# Patient Record
Sex: Female | Born: 1945 | Race: White | Hispanic: No | Smoking: Never smoker
Health system: Southern US, Community
[De-identification: ages and names within clinical notes are randomized; demographics above are authoritative.]

## PROBLEM LIST (undated history)

## (undated) DIAGNOSIS — M1990 Unspecified osteoarthritis, unspecified site: Secondary | ICD-10-CM

## (undated) DIAGNOSIS — N189 Chronic kidney disease, unspecified: Secondary | ICD-10-CM

## (undated) DIAGNOSIS — Z87442 Personal history of urinary calculi: Secondary | ICD-10-CM

## (undated) DIAGNOSIS — R7303 Prediabetes: Secondary | ICD-10-CM

## (undated) DIAGNOSIS — I10 Essential (primary) hypertension: Secondary | ICD-10-CM

## (undated) DIAGNOSIS — I639 Cerebral infarction, unspecified: Secondary | ICD-10-CM

## (undated) DIAGNOSIS — I2510 Atherosclerotic heart disease of native coronary artery without angina pectoris: Secondary | ICD-10-CM

## (undated) DIAGNOSIS — R519 Headache, unspecified: Secondary | ICD-10-CM

## (undated) DIAGNOSIS — F419 Anxiety disorder, unspecified: Secondary | ICD-10-CM

## (undated) HISTORY — DX: Cerebral infarction, unspecified: I63.9

---

## 1977-08-26 HISTORY — PX: VARICOSE VEIN SURGERY: SHX832

## 1977-11-24 HISTORY — PX: TUBAL LIGATION: SHX77

## 1999-09-21 ENCOUNTER — Other Ambulatory Visit: Admission: RE | Admit: 1999-09-21 | Discharge: 1999-09-21 | Payer: Self-pay | Admitting: Family Medicine

## 2000-10-08 ENCOUNTER — Other Ambulatory Visit: Admission: RE | Admit: 2000-10-08 | Discharge: 2000-10-08 | Payer: Self-pay | Admitting: *Deleted

## 2001-10-21 ENCOUNTER — Other Ambulatory Visit: Admission: RE | Admit: 2001-10-21 | Discharge: 2001-10-21 | Payer: Self-pay | Admitting: *Deleted

## 2003-11-02 ENCOUNTER — Other Ambulatory Visit: Admission: RE | Admit: 2003-11-02 | Discharge: 2003-11-02 | Payer: Self-pay | Admitting: Family Medicine

## 2005-01-31 ENCOUNTER — Other Ambulatory Visit: Admission: RE | Admit: 2005-01-31 | Discharge: 2005-01-31 | Payer: Self-pay | Admitting: Family Medicine

## 2007-06-03 ENCOUNTER — Emergency Department (HOSPITAL_COMMUNITY): Admission: EM | Admit: 2007-06-03 | Discharge: 2007-06-03 | Payer: Self-pay | Admitting: Emergency Medicine

## 2007-07-21 ENCOUNTER — Ambulatory Visit: Payer: Self-pay | Admitting: Cardiology

## 2007-07-31 ENCOUNTER — Ambulatory Visit: Payer: Self-pay

## 2007-08-13 ENCOUNTER — Ambulatory Visit: Payer: Self-pay | Admitting: Cardiology

## 2011-01-08 NOTE — Assessment & Plan Note (Signed)
[facility]                            CARDIOLOGY OFFICE NOTE   CIJI, BOSTON                      MRN:          [id number]  DATE:08/13/2007                            DOB:          [birth year]    PRIMARY CARE PHYSICIAN:  Talmadge Coventry, M.D.   REASON FOR VISIT:  Follow up cardiac testing.   HISTORY OF PRESENT ILLNESS:  I saw Ms. Jolley back in November.  She had  symptoms at that time including episodic weakness and presyncope as well  as fairly atypical chest pain/lower costal discomfort.  I referred her  for a baseline ischemic evaluation and she underwent an exercise  Myoview. This study revealed no clearly diagnostic ST segment changes to  suggest ischemia, a normal ejection fraction of 71% with normal wall  motion, and normal perfusion imaging.  I reviewed these results with the  patient today.  She states that she has had a few episodes of costal  discomfort, typically toward the evening, but no other new symptoms.  She did seem reassured by her cardiac testing and at this point I would  not anticipate any further cardiac studies.  It may be worth considering  further imaging of the thorax, such as a chest CT scan, if her symptoms  persist.  I have asked her to review this with Dr. Smith Mince.   ALLERGIES:  CODEINE.   PRESENT MEDICATIONS:  1. Atenolol 25 mg p.o. b.i.d.  2. Triamterine/hydrochlorothiazide 37.5/25 mg p.o. daily.  3. Lexapro 5 mg p.o. daily.  4. Prempro 0.625 mg/2.5 mg p.o. daily.  5. Midrin two tablets as directed.  6. Zolpidem 10 mg p.o. q.h.s.  7. Calcium with vitamin C.  8. Omega-3 supplements.   REVIEW OF SYSTEMS:  As described in the history of present illness.   EXAMINATION:  Blood pressure 139/90, heart rate is 81, weight is 154  pounds.  Otherwise no significant change in baseline examination as  documented in the last note.   IMPRESSION AND RECOMMENDATIONS:  1. Atypical chest/costal discomfort with  very reassuring Cardiolite      demonstrating no ischemia either by electrocardiographic or      perfusion imaging data.  Ejection fraction was also normal.  I      doubt that further ischemic testing is needed at this time.  I      expect Ms. Cassel can continue to follow up with Dr. Smith Mince.      Consideration could be given to further      imaging of the thorax such as a CT scan of the chest, although I      will defer to her on this.  2. Cardiology follow-up can be as needed.     Jonelle Sidle, MD  Electronically Signed    SGM/MedQ  DD: 08/13/2007  DT: 08/14/2007  Job #: [id number]   cc:   Talmadge Coventry, M.D.

## 2011-01-08 NOTE — Assessment & Plan Note (Signed)
[facility]                            CARDIOLOGY OFFICE NOTE   Cassandra Bailey, Cassandra Bailey                      MRN:          [id number]  DATE:07/21/2007                            DOB:          1945/12/18    REASON FOR CONSULTATION:  Recent chest pain.   HISTORY OF PRESENT ILLNESS:  Cassandra Bailey is a pleasant 65 year old woman  with recently diagnosed hypertension, treated with beta-blocker therapy  and diuretic therapy, and no clearly documented history of  cardiovascular disease or myocardial infarction.  She has had a number  of different symptoms over the last 1-2 months.  Back in early October,  she states that she was at work [employer]) and began to  feel weak as if she might pass out.  She was taken to the [facility]  Emergency Department and in reviewing the records she was not describing  any chest pain at that time.  I note that her blood work revealed a  point-of-care troponin I level that was normal at less than 0.05 with a  normal myoglobin of 73.3.  She tells me that no specific diagnosis was  made and since that time she has not had any recurrence of this problem.   She has also had trouble with her blood pressure being elevated,  reporting measurements such as 182/99 prompting  initial therapy with Coreg CR, although this was changed to a diuretic  and atenolol over the last month by Dr. Smith Mince.  She has also been  placed on Lexapro recently.  She has had some problems with anxiety,  possibly depression.  She began experiencing some symptoms of lower costal discomfort both  anteriorly and posteriorly back around November 13th, and she seemed to  notice this somewhat cyclically around 7 p.m. each night for several  days, although over the last week she has not noticed these symptoms.  She found no other precipitant for it and states this would usually last  about an hour.   In addition, she has had a strain sensation in  her upper back and  between her shoulder blades.  She seems to feel this is worse when she  stands up.  She reports having a standard treadmill test back in 2001,  in the setting of palpitations and reportedly this was normal.  She has  had no other recent testing.   Parenthetically, she also mentions a history of intermittent heart  skips which sound very much like premature ventricular complexes based  on her description.  She has had no sustained palpitations since  previous anxiety attacks, when she was going through a divorce.   Her electrocardiogram in the clinic today is normal, showing a sinus  rhythm at 67 beats per minute.   ALLERGIES:  CODEINE.   PRESENT MEDICATIONS:  1. Atenolol 25 mg p.o. b.i.d.  2. Triamterene/hydrochlorothiazide 37.5/25 mg p.o. daily.  3. Lexapro 5 mg p.o. daily.  4. Prempro 0.625/2.5 mg p.o. daily.  5. Midrin 2 tablets as directed.  6. Zolpidem 10 mg p.o. q.h.s.  7. Calcium with vitamin D supplements.  8.  Omega-3 supplements 1200 mg p.o. daily.  9. Sudafed p.r.n.  10.Motrin p.r.n.   PAST MEDICAL HISTORY:  As outlined above. The patient has a previous  history of:  1. Leg vein surgery.  2. Tubal ligation.  No clear history of dyslipidemia or type 2 diabetes mellitus.   SOCIAL HISTORY:  The patient is divorced.  She has 2 children.  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  She denies  any tobacco use.  She drinks perhaps 2 glasses of wine a week and 2 cups  of coffee daily.  No exercising currently, although she has done some  walking regularly in the past.   FAMILY HISTORY:  Reviewed.  The patient's died at age 69 with cancer,  having experienced a heart attack 2 years prior.  Mother died at age 8  with pneumonia.   REVIEW OF SYSTEMS:  As described in the history of present illness, also  has intermittent arthritic symptoms, anxiety, some fatigue, intermittent  constipation, seasonal allergies, otherwise  negative.   PHYSICAL EXAMINATION:  VITAL SIGNS:  Blood pressure 125/85, heart rate  is 67, weight 154.  GENERAL:  This a normally nourished appearing woman, no acute distress.  HEENT:  Conjunctivae and lids are normal.  Oropharynx clear.  NECK:  Supple.  No elevated jugular venous pressure .  No loud bruits.  No thyromegaly is noted.  LUNGS:  Clear, non-labored breathing.  No pleural rub.  CARDIAC:  Reveals a regular rate and rhythm.  No pericardial rub.  No  loud systolic murmur.  No S3 gallop.  ABDOMEN:  Soft, nontender.  Normoactive bowel sounds.  EXTREMITIES:  Exhibit no pitting edema.  Distal pulses are 2 plus.  SKIN:  Warm and dry.  MUSCULOSKELETAL:  No kyphosis noted.  NEUROPSYCHIATRIC:  The patient is alert and oriented x3.  Affect is  normal.   IMPRESSION/RECOMMENDATION:  1. Recent symptoms as outlined above.  The patient reports an episode      of weakness and presyncope back in early October of uncertain      etiology but associated with no chest pain and normal cardiac      markers following an emergency department visit.  Her      electrocardiogram today is normal.  She has also had some fairly      atypical lower costal pain described which has subsequently      resolved and upper intrascapular back discomfort.  She does report      a previous normal standard treadmill test in 2001, but has had no      followup ischemic evaluation.  We discussed proceeding with an      exercise Myoview for further risk stratification and I will      otherwise see her back to go over the results over the next few      weeks.  2. Further plans to follow.     Jonelle Sidle, MD  Electronically Signed    SGM/MedQ  DD: 07/21/2007  DT: 07/21/2007  Job #: [id number]   cc:   Talmadge Coventry, M.D.

## 2011-06-06 LAB — POCT I-STAT CREATININE: Operator id: 294521

## 2011-06-06 LAB — URINALYSIS, ROUTINE W REFLEX MICROSCOPIC
Bilirubin Urine: NEGATIVE
Nitrite: NEGATIVE
Protein, ur: NEGATIVE
Specific Gravity, Urine: 1.013
Urobilinogen, UA: 0.2

## 2011-06-06 LAB — POCT CARDIAC MARKERS
CKMB, poc: 1.3
Operator id: 294521
Troponin i, poc: <0.05

## 2011-06-06 LAB — I-STAT 8, (EC8 V) (CONVERTED LAB)
BUN: 11
Bicarbonate: 28.2 — ABNORMAL HIGH
Glucose, Bld: 125 — ABNORMAL HIGH
Hemoglobin: 14.6
Sodium: 138
TCO2: 30
pCO2, Ven: 48.2

## 2011-06-06 LAB — URINE CULTURE

## 2011-09-26 DIAGNOSIS — H524 Presbyopia: Secondary | ICD-10-CM | POA: Diagnosis not present

## 2011-09-26 DIAGNOSIS — E119 Type 2 diabetes mellitus without complications: Secondary | ICD-10-CM | POA: Diagnosis not present

## 2011-10-30 DIAGNOSIS — L600 Ingrowing nail: Secondary | ICD-10-CM | POA: Diagnosis not present

## 2011-11-26 DIAGNOSIS — R5383 Other fatigue: Secondary | ICD-10-CM | POA: Diagnosis not present

## 2011-11-26 DIAGNOSIS — E7800 Pure hypercholesterolemia, unspecified: Secondary | ICD-10-CM | POA: Diagnosis not present

## 2011-11-26 DIAGNOSIS — E559 Vitamin D deficiency, unspecified: Secondary | ICD-10-CM | POA: Diagnosis not present

## 2011-11-26 DIAGNOSIS — Z0000 Encounter for general adult medical examination without abnormal findings: Secondary | ICD-10-CM | POA: Diagnosis not present

## 2011-11-26 DIAGNOSIS — E782 Mixed hyperlipidemia: Secondary | ICD-10-CM | POA: Diagnosis not present

## 2011-11-26 DIAGNOSIS — R5381 Other malaise: Secondary | ICD-10-CM | POA: Diagnosis not present

## 2011-11-26 DIAGNOSIS — I10 Essential (primary) hypertension: Secondary | ICD-10-CM | POA: Diagnosis not present

## 2011-11-28 DIAGNOSIS — M130 Polyarthritis, unspecified: Secondary | ICD-10-CM | POA: Diagnosis not present

## 2011-12-06 DIAGNOSIS — H908 Mixed conductive and sensorineural hearing loss, unspecified: Secondary | ICD-10-CM | POA: Diagnosis not present

## 2011-12-06 DIAGNOSIS — E785 Hyperlipidemia, unspecified: Secondary | ICD-10-CM | POA: Diagnosis not present

## 2011-12-06 DIAGNOSIS — M159 Polyosteoarthritis, unspecified: Secondary | ICD-10-CM | POA: Diagnosis not present

## 2011-12-06 DIAGNOSIS — I10 Essential (primary) hypertension: Secondary | ICD-10-CM | POA: Diagnosis not present

## 2011-12-06 DIAGNOSIS — R5381 Other malaise: Secondary | ICD-10-CM | POA: Diagnosis not present

## 2012-05-29 DIAGNOSIS — R5381 Other malaise: Secondary | ICD-10-CM | POA: Diagnosis not present

## 2012-05-29 DIAGNOSIS — R5383 Other fatigue: Secondary | ICD-10-CM | POA: Diagnosis not present

## 2012-05-29 DIAGNOSIS — I10 Essential (primary) hypertension: Secondary | ICD-10-CM | POA: Diagnosis not present

## 2012-05-29 DIAGNOSIS — E785 Hyperlipidemia, unspecified: Secondary | ICD-10-CM | POA: Diagnosis not present

## 2012-05-29 DIAGNOSIS — M159 Polyosteoarthritis, unspecified: Secondary | ICD-10-CM | POA: Diagnosis not present

## 2012-06-05 DIAGNOSIS — E785 Hyperlipidemia, unspecified: Secondary | ICD-10-CM | POA: Diagnosis not present

## 2012-06-05 DIAGNOSIS — M25579 Pain in unspecified ankle and joints of unspecified foot: Secondary | ICD-10-CM | POA: Diagnosis not present

## 2012-06-05 DIAGNOSIS — M159 Polyosteoarthritis, unspecified: Secondary | ICD-10-CM | POA: Diagnosis not present

## 2012-06-05 DIAGNOSIS — I10 Essential (primary) hypertension: Secondary | ICD-10-CM | POA: Diagnosis not present

## 2012-06-19 DIAGNOSIS — R5383 Other fatigue: Secondary | ICD-10-CM | POA: Diagnosis not present

## 2012-06-19 DIAGNOSIS — M159 Polyosteoarthritis, unspecified: Secondary | ICD-10-CM | POA: Diagnosis not present

## 2012-06-19 DIAGNOSIS — E785 Hyperlipidemia, unspecified: Secondary | ICD-10-CM | POA: Diagnosis not present

## 2012-06-19 DIAGNOSIS — I10 Essential (primary) hypertension: Secondary | ICD-10-CM | POA: Diagnosis not present

## 2012-06-19 DIAGNOSIS — R5381 Other malaise: Secondary | ICD-10-CM | POA: Diagnosis not present

## 2012-07-16 DIAGNOSIS — M159 Polyosteoarthritis, unspecified: Secondary | ICD-10-CM | POA: Diagnosis not present

## 2012-07-16 DIAGNOSIS — M25579 Pain in unspecified ankle and joints of unspecified foot: Secondary | ICD-10-CM | POA: Diagnosis not present

## 2012-07-16 DIAGNOSIS — I10 Essential (primary) hypertension: Secondary | ICD-10-CM | POA: Diagnosis not present

## 2012-07-22 DIAGNOSIS — M76829 Posterior tibial tendinitis, unspecified leg: Secondary | ICD-10-CM | POA: Diagnosis not present

## 2012-07-28 DIAGNOSIS — M76829 Posterior tibial tendinitis, unspecified leg: Secondary | ICD-10-CM | POA: Diagnosis not present

## 2012-07-29 DIAGNOSIS — M76829 Posterior tibial tendinitis, unspecified leg: Secondary | ICD-10-CM | POA: Diagnosis not present

## 2012-07-31 DIAGNOSIS — M76829 Posterior tibial tendinitis, unspecified leg: Secondary | ICD-10-CM | POA: Diagnosis not present

## 2012-08-03 DIAGNOSIS — M76829 Posterior tibial tendinitis, unspecified leg: Secondary | ICD-10-CM | POA: Diagnosis not present

## 2012-08-05 DIAGNOSIS — M76829 Posterior tibial tendinitis, unspecified leg: Secondary | ICD-10-CM | POA: Diagnosis not present

## 2012-08-07 DIAGNOSIS — M76829 Posterior tibial tendinitis, unspecified leg: Secondary | ICD-10-CM | POA: Diagnosis not present

## 2012-08-10 DIAGNOSIS — M76829 Posterior tibial tendinitis, unspecified leg: Secondary | ICD-10-CM | POA: Diagnosis not present

## 2012-08-11 DIAGNOSIS — M76829 Posterior tibial tendinitis, unspecified leg: Secondary | ICD-10-CM | POA: Diagnosis not present

## 2012-08-17 DIAGNOSIS — M76829 Posterior tibial tendinitis, unspecified leg: Secondary | ICD-10-CM | POA: Diagnosis not present

## 2012-08-20 DIAGNOSIS — M76829 Posterior tibial tendinitis, unspecified leg: Secondary | ICD-10-CM | POA: Diagnosis not present

## 2012-08-24 DIAGNOSIS — M76829 Posterior tibial tendinitis, unspecified leg: Secondary | ICD-10-CM | POA: Diagnosis not present

## 2012-08-27 DIAGNOSIS — M76829 Posterior tibial tendinitis, unspecified leg: Secondary | ICD-10-CM | POA: Diagnosis not present

## 2012-08-31 DIAGNOSIS — M76829 Posterior tibial tendinitis, unspecified leg: Secondary | ICD-10-CM | POA: Diagnosis not present

## 2012-09-04 DIAGNOSIS — M76829 Posterior tibial tendinitis, unspecified leg: Secondary | ICD-10-CM | POA: Diagnosis not present

## 2012-12-18 DIAGNOSIS — M159 Polyosteoarthritis, unspecified: Secondary | ICD-10-CM | POA: Diagnosis not present

## 2012-12-18 DIAGNOSIS — I10 Essential (primary) hypertension: Secondary | ICD-10-CM | POA: Diagnosis not present

## 2012-12-18 DIAGNOSIS — R5383 Other fatigue: Secondary | ICD-10-CM | POA: Diagnosis not present

## 2012-12-18 DIAGNOSIS — R5381 Other malaise: Secondary | ICD-10-CM | POA: Diagnosis not present

## 2012-12-18 DIAGNOSIS — Z1331 Encounter for screening for depression: Secondary | ICD-10-CM | POA: Diagnosis not present

## 2012-12-18 DIAGNOSIS — E785 Hyperlipidemia, unspecified: Secondary | ICD-10-CM | POA: Diagnosis not present

## 2012-12-18 DIAGNOSIS — Z0000 Encounter for general adult medical examination without abnormal findings: Secondary | ICD-10-CM | POA: Diagnosis not present

## 2012-12-25 DIAGNOSIS — Z23 Encounter for immunization: Secondary | ICD-10-CM | POA: Diagnosis not present

## 2012-12-25 DIAGNOSIS — E785 Hyperlipidemia, unspecified: Secondary | ICD-10-CM | POA: Diagnosis not present

## 2012-12-25 DIAGNOSIS — I10 Essential (primary) hypertension: Secondary | ICD-10-CM | POA: Diagnosis not present

## 2012-12-25 DIAGNOSIS — M159 Polyosteoarthritis, unspecified: Secondary | ICD-10-CM | POA: Diagnosis not present

## 2012-12-25 DIAGNOSIS — R5383 Other fatigue: Secondary | ICD-10-CM | POA: Diagnosis not present

## 2012-12-25 DIAGNOSIS — H908 Mixed conductive and sensorineural hearing loss, unspecified: Secondary | ICD-10-CM | POA: Diagnosis not present

## 2012-12-25 DIAGNOSIS — N951 Menopausal and female climacteric states: Secondary | ICD-10-CM | POA: Diagnosis not present

## 2012-12-25 DIAGNOSIS — R5381 Other malaise: Secondary | ICD-10-CM | POA: Diagnosis not present

## 2013-12-17 DIAGNOSIS — L600 Ingrowing nail: Secondary | ICD-10-CM | POA: Diagnosis not present

## 2013-12-17 DIAGNOSIS — M79609 Pain in unspecified limb: Secondary | ICD-10-CM | POA: Diagnosis not present

## 2013-12-17 DIAGNOSIS — L03039 Cellulitis of unspecified toe: Secondary | ICD-10-CM | POA: Diagnosis not present

## 2013-12-24 DIAGNOSIS — M79609 Pain in unspecified limb: Secondary | ICD-10-CM | POA: Diagnosis not present

## 2013-12-24 DIAGNOSIS — B351 Tinea unguium: Secondary | ICD-10-CM | POA: Diagnosis not present

## 2014-01-07 DIAGNOSIS — I10 Essential (primary) hypertension: Secondary | ICD-10-CM | POA: Diagnosis not present

## 2014-01-07 DIAGNOSIS — R5381 Other malaise: Secondary | ICD-10-CM | POA: Diagnosis not present

## 2014-01-07 DIAGNOSIS — M159 Polyosteoarthritis, unspecified: Secondary | ICD-10-CM | POA: Diagnosis not present

## 2014-01-07 DIAGNOSIS — T8189XA Other complications of procedures, not elsewhere classified, initial encounter: Secondary | ICD-10-CM | POA: Diagnosis not present

## 2014-01-07 DIAGNOSIS — E785 Hyperlipidemia, unspecified: Secondary | ICD-10-CM | POA: Diagnosis not present

## 2014-01-14 DIAGNOSIS — M159 Polyosteoarthritis, unspecified: Secondary | ICD-10-CM | POA: Diagnosis not present

## 2014-01-14 DIAGNOSIS — E785 Hyperlipidemia, unspecified: Secondary | ICD-10-CM | POA: Diagnosis not present

## 2014-01-14 DIAGNOSIS — N951 Menopausal and female climacteric states: Secondary | ICD-10-CM | POA: Diagnosis not present

## 2014-01-14 DIAGNOSIS — I10 Essential (primary) hypertension: Secondary | ICD-10-CM | POA: Diagnosis not present

## 2014-01-21 DIAGNOSIS — B351 Tinea unguium: Secondary | ICD-10-CM | POA: Diagnosis not present

## 2014-01-21 DIAGNOSIS — M79609 Pain in unspecified limb: Secondary | ICD-10-CM | POA: Diagnosis not present

## 2014-01-24 DIAGNOSIS — T8189XA Other complications of procedures, not elsewhere classified, initial encounter: Secondary | ICD-10-CM | POA: Diagnosis not present

## 2014-02-18 DIAGNOSIS — B351 Tinea unguium: Secondary | ICD-10-CM | POA: Diagnosis not present

## 2014-02-18 DIAGNOSIS — M79609 Pain in unspecified limb: Secondary | ICD-10-CM | POA: Diagnosis not present

## 2014-03-18 DIAGNOSIS — M79609 Pain in unspecified limb: Secondary | ICD-10-CM | POA: Diagnosis not present

## 2014-03-18 DIAGNOSIS — Z01818 Encounter for other preprocedural examination: Secondary | ICD-10-CM | POA: Diagnosis not present

## 2014-03-18 DIAGNOSIS — B351 Tinea unguium: Secondary | ICD-10-CM | POA: Diagnosis not present

## 2014-06-10 DIAGNOSIS — M79609 Pain in unspecified limb: Secondary | ICD-10-CM | POA: Diagnosis not present

## 2014-07-01 DIAGNOSIS — E784 Other hyperlipidemia: Secondary | ICD-10-CM | POA: Diagnosis not present

## 2014-07-08 DIAGNOSIS — I10 Essential (primary) hypertension: Secondary | ICD-10-CM | POA: Diagnosis not present

## 2014-07-08 DIAGNOSIS — E782 Mixed hyperlipidemia: Secondary | ICD-10-CM | POA: Diagnosis not present

## 2014-09-30 DIAGNOSIS — H2513 Age-related nuclear cataract, bilateral: Secondary | ICD-10-CM | POA: Diagnosis not present

## 2014-11-28 DIAGNOSIS — H2511 Age-related nuclear cataract, right eye: Secondary | ICD-10-CM | POA: Diagnosis not present

## 2014-11-28 DIAGNOSIS — H04121 Dry eye syndrome of right lacrimal gland: Secondary | ICD-10-CM | POA: Diagnosis not present

## 2014-11-28 DIAGNOSIS — H04122 Dry eye syndrome of left lacrimal gland: Secondary | ICD-10-CM | POA: Diagnosis not present

## 2014-11-28 DIAGNOSIS — H18411 Arcus senilis, right eye: Secondary | ICD-10-CM | POA: Diagnosis not present

## 2014-12-27 DIAGNOSIS — H2511 Age-related nuclear cataract, right eye: Secondary | ICD-10-CM | POA: Diagnosis not present

## 2014-12-28 DIAGNOSIS — H25042 Posterior subcapsular polar age-related cataract, left eye: Secondary | ICD-10-CM | POA: Diagnosis not present

## 2014-12-28 DIAGNOSIS — H25012 Cortical age-related cataract, left eye: Secondary | ICD-10-CM | POA: Diagnosis not present

## 2014-12-28 DIAGNOSIS — H2512 Age-related nuclear cataract, left eye: Secondary | ICD-10-CM | POA: Diagnosis not present

## 2015-01-10 DIAGNOSIS — H2512 Age-related nuclear cataract, left eye: Secondary | ICD-10-CM | POA: Diagnosis not present

## 2015-01-20 DIAGNOSIS — I10 Essential (primary) hypertension: Secondary | ICD-10-CM | POA: Diagnosis not present

## 2015-01-20 DIAGNOSIS — E782 Mixed hyperlipidemia: Secondary | ICD-10-CM | POA: Diagnosis not present

## 2015-01-20 DIAGNOSIS — R5381 Other malaise: Secondary | ICD-10-CM | POA: Diagnosis not present

## 2015-01-27 DIAGNOSIS — Z780 Asymptomatic menopausal state: Secondary | ICD-10-CM | POA: Diagnosis not present

## 2015-01-27 DIAGNOSIS — L918 Other hypertrophic disorders of the skin: Secondary | ICD-10-CM | POA: Diagnosis not present

## 2015-01-27 DIAGNOSIS — I10 Essential (primary) hypertension: Secondary | ICD-10-CM | POA: Diagnosis not present

## 2015-01-27 DIAGNOSIS — E782 Mixed hyperlipidemia: Secondary | ICD-10-CM | POA: Diagnosis not present

## 2015-02-17 DIAGNOSIS — D1724 Benign lipomatous neoplasm of skin and subcutaneous tissue of left leg: Secondary | ICD-10-CM | POA: Diagnosis not present

## 2015-06-09 ENCOUNTER — Telehealth: Payer: Self-pay | Admitting: *Deleted

## 2015-06-09 NOTE — Telephone Encounter (Signed)
Opened in error

## 2015-07-28 DIAGNOSIS — E782 Mixed hyperlipidemia: Secondary | ICD-10-CM | POA: Diagnosis not present

## 2015-07-28 DIAGNOSIS — I10 Essential (primary) hypertension: Secondary | ICD-10-CM | POA: Diagnosis not present

## 2015-08-04 DIAGNOSIS — I10 Essential (primary) hypertension: Secondary | ICD-10-CM | POA: Diagnosis not present

## 2015-08-04 DIAGNOSIS — E782 Mixed hyperlipidemia: Secondary | ICD-10-CM | POA: Diagnosis not present

## 2015-08-04 DIAGNOSIS — Z780 Asymptomatic menopausal state: Secondary | ICD-10-CM | POA: Diagnosis not present

## 2015-08-04 DIAGNOSIS — M150 Primary generalized (osteo)arthritis: Secondary | ICD-10-CM | POA: Diagnosis not present

## 2016-02-16 DIAGNOSIS — M150 Primary generalized (osteo)arthritis: Secondary | ICD-10-CM | POA: Diagnosis not present

## 2016-02-16 DIAGNOSIS — Z0000 Encounter for general adult medical examination without abnormal findings: Secondary | ICD-10-CM | POA: Diagnosis not present

## 2016-02-16 DIAGNOSIS — I10 Essential (primary) hypertension: Secondary | ICD-10-CM | POA: Diagnosis not present

## 2016-02-16 DIAGNOSIS — E782 Mixed hyperlipidemia: Secondary | ICD-10-CM | POA: Diagnosis not present

## 2016-03-01 DIAGNOSIS — R3129 Other microscopic hematuria: Secondary | ICD-10-CM | POA: Diagnosis not present

## 2016-03-01 DIAGNOSIS — M150 Primary generalized (osteo)arthritis: Secondary | ICD-10-CM | POA: Diagnosis not present

## 2016-03-01 DIAGNOSIS — E782 Mixed hyperlipidemia: Secondary | ICD-10-CM | POA: Diagnosis not present

## 2016-03-01 DIAGNOSIS — N390 Urinary tract infection, site not specified: Secondary | ICD-10-CM | POA: Diagnosis not present

## 2016-03-15 DIAGNOSIS — N390 Urinary tract infection, site not specified: Secondary | ICD-10-CM | POA: Diagnosis not present

## 2016-03-15 DIAGNOSIS — R3129 Other microscopic hematuria: Secondary | ICD-10-CM | POA: Diagnosis not present

## 2016-03-15 DIAGNOSIS — I10 Essential (primary) hypertension: Secondary | ICD-10-CM | POA: Diagnosis not present

## 2016-03-22 DIAGNOSIS — M150 Primary generalized (osteo)arthritis: Secondary | ICD-10-CM | POA: Diagnosis not present

## 2016-03-22 DIAGNOSIS — I10 Essential (primary) hypertension: Secondary | ICD-10-CM | POA: Diagnosis not present

## 2016-03-22 DIAGNOSIS — R3129 Other microscopic hematuria: Secondary | ICD-10-CM | POA: Diagnosis not present

## 2016-03-22 DIAGNOSIS — E782 Mixed hyperlipidemia: Secondary | ICD-10-CM | POA: Diagnosis not present

## 2016-04-26 ENCOUNTER — Other Ambulatory Visit: Payer: Self-pay

## 2016-10-04 DIAGNOSIS — E782 Mixed hyperlipidemia: Secondary | ICD-10-CM | POA: Diagnosis not present

## 2016-10-04 DIAGNOSIS — N390 Urinary tract infection, site not specified: Secondary | ICD-10-CM | POA: Diagnosis not present

## 2016-10-11 DIAGNOSIS — M150 Primary generalized (osteo)arthritis: Secondary | ICD-10-CM | POA: Diagnosis not present

## 2016-10-11 DIAGNOSIS — I10 Essential (primary) hypertension: Secondary | ICD-10-CM | POA: Diagnosis not present

## 2016-10-11 DIAGNOSIS — E782 Mixed hyperlipidemia: Secondary | ICD-10-CM | POA: Diagnosis not present

## 2016-11-29 DIAGNOSIS — I10 Essential (primary) hypertension: Secondary | ICD-10-CM | POA: Diagnosis not present

## 2016-12-05 DIAGNOSIS — E782 Mixed hyperlipidemia: Secondary | ICD-10-CM | POA: Diagnosis not present

## 2016-12-05 DIAGNOSIS — I10 Essential (primary) hypertension: Secondary | ICD-10-CM | POA: Diagnosis not present

## 2017-01-17 DIAGNOSIS — E782 Mixed hyperlipidemia: Secondary | ICD-10-CM | POA: Diagnosis not present

## 2017-01-17 DIAGNOSIS — I10 Essential (primary) hypertension: Secondary | ICD-10-CM | POA: Diagnosis not present

## 2017-01-17 DIAGNOSIS — M150 Primary generalized (osteo)arthritis: Secondary | ICD-10-CM | POA: Diagnosis not present

## 2017-03-21 DIAGNOSIS — Z0000 Encounter for general adult medical examination without abnormal findings: Secondary | ICD-10-CM | POA: Diagnosis not present

## 2017-03-21 DIAGNOSIS — I10 Essential (primary) hypertension: Secondary | ICD-10-CM | POA: Diagnosis not present

## 2017-03-21 DIAGNOSIS — N390 Urinary tract infection, site not specified: Secondary | ICD-10-CM | POA: Diagnosis not present

## 2017-03-21 DIAGNOSIS — E782 Mixed hyperlipidemia: Secondary | ICD-10-CM | POA: Diagnosis not present

## 2017-03-28 DIAGNOSIS — M150 Primary generalized (osteo)arthritis: Secondary | ICD-10-CM | POA: Diagnosis not present

## 2017-03-28 DIAGNOSIS — I10 Essential (primary) hypertension: Secondary | ICD-10-CM | POA: Diagnosis not present

## 2017-03-28 DIAGNOSIS — R1033 Periumbilical pain: Secondary | ICD-10-CM | POA: Diagnosis not present

## 2017-03-28 DIAGNOSIS — Z23 Encounter for immunization: Secondary | ICD-10-CM | POA: Diagnosis not present

## 2017-03-28 DIAGNOSIS — E782 Mixed hyperlipidemia: Secondary | ICD-10-CM | POA: Diagnosis not present

## 2017-03-28 DIAGNOSIS — Z780 Asymptomatic menopausal state: Secondary | ICD-10-CM | POA: Diagnosis not present

## 2017-06-06 DIAGNOSIS — I10 Essential (primary) hypertension: Secondary | ICD-10-CM | POA: Diagnosis not present

## 2017-06-06 DIAGNOSIS — E782 Mixed hyperlipidemia: Secondary | ICD-10-CM | POA: Diagnosis not present

## 2017-07-04 DIAGNOSIS — S92351A Displaced fracture of fifth metatarsal bone, right foot, initial encounter for closed fracture: Secondary | ICD-10-CM | POA: Diagnosis not present

## 2017-07-04 DIAGNOSIS — S92302A Fracture of unspecified metatarsal bone(s), left foot, initial encounter for closed fracture: Secondary | ICD-10-CM | POA: Diagnosis not present

## 2017-07-04 DIAGNOSIS — M79672 Pain in left foot: Secondary | ICD-10-CM | POA: Diagnosis not present

## 2017-07-25 DIAGNOSIS — S92335A Nondisplaced fracture of third metatarsal bone, left foot, initial encounter for closed fracture: Secondary | ICD-10-CM | POA: Diagnosis not present

## 2017-08-20 DIAGNOSIS — S92335D Nondisplaced fracture of third metatarsal bone, left foot, subsequent encounter for fracture with routine healing: Secondary | ICD-10-CM | POA: Diagnosis not present

## 2017-09-29 DIAGNOSIS — I10 Essential (primary) hypertension: Secondary | ICD-10-CM | POA: Diagnosis not present

## 2017-09-29 DIAGNOSIS — E782 Mixed hyperlipidemia: Secondary | ICD-10-CM | POA: Diagnosis not present

## 2017-10-10 DIAGNOSIS — I10 Essential (primary) hypertension: Secondary | ICD-10-CM | POA: Diagnosis not present

## 2017-10-10 DIAGNOSIS — E782 Mixed hyperlipidemia: Secondary | ICD-10-CM | POA: Diagnosis not present

## 2017-10-17 DIAGNOSIS — I10 Essential (primary) hypertension: Secondary | ICD-10-CM | POA: Diagnosis not present

## 2017-10-17 DIAGNOSIS — M150 Primary generalized (osteo)arthritis: Secondary | ICD-10-CM | POA: Diagnosis not present

## 2017-10-17 DIAGNOSIS — J399 Disease of upper respiratory tract, unspecified: Secondary | ICD-10-CM | POA: Diagnosis not present

## 2017-10-17 DIAGNOSIS — R0981 Nasal congestion: Secondary | ICD-10-CM | POA: Diagnosis not present

## 2017-10-17 DIAGNOSIS — R0982 Postnasal drip: Secondary | ICD-10-CM | POA: Diagnosis not present

## 2017-10-17 DIAGNOSIS — E782 Mixed hyperlipidemia: Secondary | ICD-10-CM | POA: Diagnosis not present

## 2017-10-17 DIAGNOSIS — B349 Viral infection, unspecified: Secondary | ICD-10-CM | POA: Diagnosis not present

## 2017-11-09 ENCOUNTER — Inpatient Hospital Stay (HOSPITAL_COMMUNITY)
Admission: EM | Admit: 2017-11-09 | Discharge: 2017-11-11 | DRG: 042 | Disposition: A | Discharge status: Home / Self Care | Payer: Medicare Other | Attending: Internal Medicine | Admitting: Internal Medicine

## 2017-11-09 ENCOUNTER — Encounter (HOSPITAL_COMMUNITY): Payer: Self-pay

## 2017-11-09 ENCOUNTER — Other Ambulatory Visit: Payer: Self-pay

## 2017-11-09 ENCOUNTER — Emergency Department (HOSPITAL_COMMUNITY): Payer: Medicare Other

## 2017-11-09 DIAGNOSIS — R29700 NIHSS score 0: Secondary | ICD-10-CM | POA: Diagnosis not present

## 2017-11-09 DIAGNOSIS — Z6827 Body mass index (BMI) 27.0-27.9, adult: Secondary | ICD-10-CM

## 2017-11-09 DIAGNOSIS — I10 Essential (primary) hypertension: Secondary | ICD-10-CM | POA: Diagnosis not present

## 2017-11-09 DIAGNOSIS — G43909 Migraine, unspecified, not intractable, without status migrainosus: Secondary | ICD-10-CM | POA: Diagnosis present

## 2017-11-09 DIAGNOSIS — E663 Overweight: Secondary | ICD-10-CM | POA: Diagnosis present

## 2017-11-09 DIAGNOSIS — I639 Cerebral infarction, unspecified: Secondary | ICD-10-CM

## 2017-11-09 DIAGNOSIS — E785 Hyperlipidemia, unspecified: Secondary | ICD-10-CM | POA: Diagnosis not present

## 2017-11-09 DIAGNOSIS — Z79890 Hormone replacement therapy (postmenopausal): Secondary | ICD-10-CM

## 2017-11-09 DIAGNOSIS — Z79899 Other long term (current) drug therapy: Secondary | ICD-10-CM

## 2017-11-09 DIAGNOSIS — W1830XA Fall on same level, unspecified, initial encounter: Secondary | ICD-10-CM | POA: Diagnosis present

## 2017-11-09 DIAGNOSIS — F419 Anxiety disorder, unspecified: Secondary | ICD-10-CM | POA: Diagnosis present

## 2017-11-09 DIAGNOSIS — S299XXA Unspecified injury of thorax, initial encounter: Secondary | ICD-10-CM | POA: Diagnosis not present

## 2017-11-09 DIAGNOSIS — R0781 Pleurodynia: Secondary | ICD-10-CM | POA: Diagnosis not present

## 2017-11-09 DIAGNOSIS — I63341 Cerebral infarction due to thrombosis of right cerebellar artery: Principal | ICD-10-CM | POA: Diagnosis present

## 2017-11-09 DIAGNOSIS — E876 Hypokalemia: Secondary | ICD-10-CM | POA: Diagnosis present

## 2017-11-09 DIAGNOSIS — Z791 Long term (current) use of non-steroidal anti-inflammatories (NSAID): Secondary | ICD-10-CM

## 2017-11-09 DIAGNOSIS — S79912A Unspecified injury of left hip, initial encounter: Secondary | ICD-10-CM | POA: Diagnosis not present

## 2017-11-09 DIAGNOSIS — Z8249 Family history of ischemic heart disease and other diseases of the circulatory system: Secondary | ICD-10-CM

## 2017-11-09 DIAGNOSIS — G4700 Insomnia, unspecified: Secondary | ICD-10-CM | POA: Diagnosis present

## 2017-11-09 DIAGNOSIS — Y92009 Unspecified place in unspecified non-institutional (private) residence as the place of occurrence of the external cause: Secondary | ICD-10-CM

## 2017-11-09 DIAGNOSIS — Y93K1 Activity, walking an animal: Secondary | ICD-10-CM

## 2017-11-09 DIAGNOSIS — M25552 Pain in left hip: Secondary | ICD-10-CM | POA: Diagnosis present

## 2017-11-09 DIAGNOSIS — R42 Dizziness and giddiness: Secondary | ICD-10-CM | POA: Diagnosis not present

## 2017-11-09 DIAGNOSIS — I6300 Cerebral infarction due to thrombosis of unspecified precerebral artery: Secondary | ICD-10-CM

## 2017-11-09 DIAGNOSIS — I2510 Atherosclerotic heart disease of native coronary artery without angina pectoris: Secondary | ICD-10-CM | POA: Diagnosis present

## 2017-11-09 DIAGNOSIS — S0990XA Unspecified injury of head, initial encounter: Secondary | ICD-10-CM | POA: Diagnosis not present

## 2017-11-09 HISTORY — DX: Cerebral infarction, unspecified: I63.9

## 2017-11-09 HISTORY — DX: Atherosclerotic heart disease of native coronary artery without angina pectoris: I25.10

## 2017-11-09 HISTORY — DX: Essential (primary) hypertension: I10

## 2017-11-09 LAB — I-STAT TROPONIN, ED: Troponin i, poc: 0 ng/mL (ref 0.00–0.08)

## 2017-11-09 LAB — CBC
HCT: 41.2 % (ref 36.0–46.0)
Hemoglobin: 14.2 g/dL (ref 12.0–15.0)
MCH: 31.1 pg (ref 26.0–34.0)
MCHC: 34.5 g/dL (ref 30.0–36.0)
MCV: 90.2 fL (ref 78.0–100.0)
Platelets: 317 10*3/uL (ref 150–400)
RBC: 4.57 MIL/uL (ref 3.87–5.11)
RDW: 13.5 % (ref 11.5–15.5)
WBC: 9.2 10*3/uL (ref 4.0–10.5)

## 2017-11-09 LAB — PROTIME-INR
INR: 0.93
PROTHROMBIN TIME: 12.4 s (ref 11.4–15.2)

## 2017-11-09 NOTE — ED Triage Notes (Signed)
Patient here from home with a fall this am.  Did hit head but no wound noticed.  Not on blood thinners. Patient complaining of left hip and rib pain, X RAYS ordered. Vitals stable.  A&Ox4, ambulatory to triage.

## 2017-11-09 NOTE — ED Provider Notes (Signed)
[place] EMERGENCY DEPARTMENT Provider Note   CSN: [id number] Arrival date & time: 11/09/17  2012     History   Chief Complaint Chief Complaint  Patient presents with  . Fall  . Head Injury    HPI Cassandra Bailey is a 72 y.o. female.  72 year old female with a history of hypertension, coronary artery disease, dyslipidemia presents to the emergency department for an episode of dizziness.  She reports a history of vertiginous symptoms in the past, but states that her symptoms today felt different.  She had gone out into the garage to walk her dog around 7 AM when she had sudden onset of dizziness characterized more as balance difficulty.  She felt very nauseous with dry heaves.  This dizziness caused her to fall on her left side.  She did strike her head, but did not lose consciousness.  She denies any headache prior to her fall, but has had some residual soreness to her left temple since striking the ground.  She is not on blood thinners.  She was able to make her way back into the house until her dizziness resolved approximately 10 minutes later.  She has not had any residual dizziness and denies position change aggravating her symptoms.  She is now complaining of pain to her left chest wall as well as her left hip.  She has not had any difficulty with ambulation.  She reports recent difficulty with blood pressure control.  She is currently on 75mg  Maxide, Avapro 300mg , and Atenolol daily for BP control.  She has not been followed by a cardiologist.  No hx of recent fevers or illness.  No lightheadedness, syncope or near syncope, blurry vision, vision loss, tinnitus, hearing loss, extremity numbness or paresthesias, extremity weakness, difficulty speaking or swallowing.      Past Medical History:  Diagnosis Date  . Coronary artery disease   . Hypertension     There are no active problems to display for this patient.   History reviewed. No pertinent surgical  history.  OB History    No data available       Home Medications    Prior to Admission medications   Not on File    Family History History reviewed. No pertinent family history.  Social History Social History   Tobacco Use  . Smoking status: Never Smoker  . Smokeless tobacco: Never Used  Substance Use Topics  . Alcohol use: Yes  . Drug use: No     Allergies   Codeine   Review of Systems Review of Systems Ten systems reviewed and are negative for acute change, except as noted in the HPI.    Physical Exam Updated Vital Signs BP (!) 146/91   Pulse 84   Temp 98.1 F (36.7 C)   Resp 19   SpO2 96%   Physical Exam  Constitutional: She is oriented to person, place, and time. She appears well-developed and well-nourished. No distress.  Nontoxic appearing, pleasant.  HENT:  Head: Normocephalic and atraumatic.  Mouth/Throat: Oropharynx is clear and moist.  Symmetric rise of the uvula with phonation. No battle's sign or raccoon's eyes.  Eyes: Conjunctivae and EOM are normal. Pupils are equal, round, and reactive to light. No scleral icterus.  Neck: Normal range of motion.  Cardiovascular: Normal rate, regular rhythm and intact distal pulses.  Pulmonary/Chest: Effort normal. No stridor. No respiratory distress.  Respirations even and unlabored  Musculoskeletal: Normal range of motion.  Neurological: She is alert and  oriented to person, place, and time. No cranial nerve deficit. She exhibits normal muscle tone. Coordination normal.  GCS 15. Speech is goal oriented. No cranial nerve deficits appreciated; symmetric eyebrow raise, no facial drooping, tongue midline. Patient has equal grip strength bilaterally with 5/5 strength against resistance in all major muscle groups bilaterally. Sensation to light touch intact. Patient moves extremities without ataxia. No pronator drift. Patient ambulatory with steady gait.  Skin: Skin is warm and dry. No rash noted. She is not  diaphoretic. No erythema. No pallor.  Psychiatric: She has a normal mood and affect. Her behavior is normal.  Nursing note and vitals reviewed.    ED Treatments / Results  Labs (all labs ordered are listed, but only abnormal results are displayed) Labs Reviewed  COMPREHENSIVE METABOLIC PANEL - Abnormal; Notable for the following components:      Result Value   Potassium 3.4 (*)    Chloride 100 (*)    Glucose, Bld 113 (*)    All other components within normal limits  CBC  PROTIME-INR  RAPID URINE DRUG SCREEN, HOSP PERFORMED  I-STAT TROPONIN, ED    EKG  EKG Interpretation  Date/Time:  Sunday November 09 2017 23:36:03 EDT Ventricular Rate:  78 PR Interval:  174 QRS Duration: 84 QT Interval:  384 QTC Calculation: 437 R Axis:   52 Text Interpretation:  Normal sinus rhythm Normal ECG No significant change since last tracing Confirmed by Merrily Pew [phone]) on 11/10/2017 12:04:13 AM       Radiology Dg Ribs Unilateral W/chest Left  Result Date: 11/09/2017 CLINICAL DATA:  Fall at home in garage onto concrete floor. Left rib pain. EXAM: LEFT RIBS AND CHEST - 3+ VIEW COMPARISON:  None. FINDINGS: No evidence fracture or other bone lesions are seen involving the ribs. There is no evidence of pneumothorax or pleural effusion. Minimal subsegmental atelectasis at the left lung base. Heart size and mediastinal contours are within normal limits. IMPRESSION: 1. No visualized rib fracture or pulmonary complication. 2. Minimal subsegmental left basilar atelectasis. Electronically Signed   By: Jeb Levering M.D.   On: 11/09/2017 21:58   Ct Head Wo Contrast  Result Date: 11/09/2017 CLINICAL DATA:  Vertigo with fall EXAM: CT HEAD WITHOUT CONTRAST TECHNIQUE: Contiguous axial images were obtained from the base of the skull through the vertex without intravenous contrast. COMPARISON:  None. FINDINGS: Brain: There is age related volume loss. There is no intracranial mass, hemorrhage, extra-axial  fluid collection, or midline shift. There is decreased attenuation in the left medial pons-midbrain junction consistent with an age uncertain and potentially recent infarct. Elsewhere there is slight small vessel disease in the centra semiovale bilaterally. Vascular: There are no hyperdense vessels. There is calcification in each carotid siphon. Skull: Bony calvarium appears intact. Sinuses/Orbits: Paranasal sinuses are clear. Orbits appear symmetric bilaterally. Other: Mastoid air cells are clear. IMPRESSION: Age uncertain but potentially recent infarct in the medial left pons-midbrain junction. There is slight periventricular small vessel disease. Elsewhere gray-white compartments appear normal. No mass or hemorrhage. There are foci of arterial vascular calcification. Electronically Signed   By: Lowella Grip III M.D.   On: 11/09/2017 23:49   Dg Hip Unilat With Pelvis 2-3 Views Left  Result Date: 11/09/2017 CLINICAL DATA:  Initial evaluation for acute trauma, fall. EXAM: DG HIP (WITH OR WITHOUT PELVIS) 2-3V LEFT COMPARISON:  None. FINDINGS: No acute fracture or dislocation. Femoral heads in normal alignment within the acetabula. Femoral head height preserved. Bony pelvis intact. SI joints approximated.  Moderate osteoarthritic changes present about the hips bilaterally, left worse than right. No acute soft tissue abnormality. Degenerative changes noted within lower lumbar spine. IMPRESSION: 1. No acute osseous abnormality about the left hip. 2. Moderate degenerative osteoarthrosis. Electronically Signed   By: Jeannine Boga M.D.   On: 11/09/2017 21:59    Procedures Procedures (including critical care time)  Medications Ordered in ED Medications - No data to display   CRITICAL CARE Performed by: Antonietta Breach   Total critical care time: 35 minutes  Critical care time was exclusive of separately billable procedures and treating other patients.  Critical care was necessary to treat or  prevent imminent or life-threatening deterioration.  Critical care was time spent personally by me on the following activities: development of treatment plan with patient and/or surrogate as well as nursing, discussions with consultants, evaluation of patient's response to treatment, examination of patient, obtaining history from patient or surrogate, ordering and performing treatments and interventions, ordering and review of laboratory studies, ordering and review of radiographic studies, pulse oximetry and re-evaluation of patient's condition.   Initial Impression / Assessment and Plan / ED Course  I have reviewed the triage vital signs and the nursing notes.  Pertinent labs & imaging results that were available during my care of the patient were reviewed by me and considered in my medical decision making (see chart for details).     11:11 PM Patient presenting to the emergency department for a fall secondary to onset of dizziness lasting approximately 10 minutes before spontaneously resolving.  Patient reports a history of vertigo a few years ago and states that her symptoms today feel different.  Unable to exclude TIA as cause of symptoms.  ABCD2 score is 2 c/w low 90 day stroke risk.  Will initiate TIA w/u in the ED.  Patient aware of and comfortable with plan.  If labs and imaging negative, peripheral vertigo remains on the differential.  Anticipate discharge with referral to Cardiology and Neurology as well as back to PCP for follow up.  12:02 AM CT findings reviewed. Will discuss case with Neurology.  12:15 AM Case discussed with Dr. Lorraine Lax who recommend [facility] admission for further work up.  12:42 AM  Case discussed with Dr. Stana Bunting of [facility] who will admit.   Final Clinical Impressions(s) / ED Diagnoses   Final diagnoses:  Cerebrovascular accident (CVA), unspecified mechanism [facility])    ED Discharge Orders    None       Antonietta Breach, PA-C 11/10/17 0043    Merrily Pew,  MD 11/10/17 [phone]

## 2017-11-09 NOTE — ED Provider Notes (Signed)
Medical screening examination/treatment/procedure(s) were conducted as a shared visit with non-physician practitioner(s) and myself.  I personally evaluated the patient during the encounter.  72 year old female who had acute onset of dizziness earlier today unlike anything she experienced before.  She states that it was so bad she had a lean against the car when she finally did start step she fell flat on her face.  She did  hit her head and had some slight discomfort afterwards but no severe pain and she does not think she syncopized.  She does have left-sided rib pain is worse with movement and palpation but not really worse with breathing.  States that the symptoms were around for maybe 10-12 minutes that started to resolve.  She states she has a history of vertigo and this was nothing like that but cannot explain it much further.  No surrounding symptoms. Exam at this time she does have tenderness to palpation in the left side of her chest.  Hea and lungs sound normal.  Her neuro exam to include nerves, finger to nose, pronator drift and ambulation were all normal. Possible TIA versus a vagal type syndrome.  Will workup accordingly here disposition as indicated.   EKG Interpretation  Date/Time:  Sunday November 09 2017 23:36:03 EDT Ventricular Rate:  78 PR Interval:  174 QRS Duration: 84 QT Interval:  384 QTC Calculation: 437 R Axis:   52 Text Interpretation:  Normal sinus rhythm Normal ECG No significant change since last tracing Confirmed by Merrily Pew [phone]) on 11/10/2017 12:04:13 AM         Marieanne Marxen, Corene Cornea, MD 11/10/17 [phone]

## 2017-11-10 ENCOUNTER — Inpatient Hospital Stay (HOSPITAL_COMMUNITY): Payer: Medicare Other

## 2017-11-10 DIAGNOSIS — I639 Cerebral infarction, unspecified: Secondary | ICD-10-CM | POA: Diagnosis not present

## 2017-11-10 DIAGNOSIS — G43909 Migraine, unspecified, not intractable, without status migrainosus: Secondary | ICD-10-CM | POA: Diagnosis present

## 2017-11-10 DIAGNOSIS — W1830XA Fall on same level, unspecified, initial encounter: Secondary | ICD-10-CM | POA: Diagnosis present

## 2017-11-10 DIAGNOSIS — R269 Unspecified abnormalities of gait and mobility: Secondary | ICD-10-CM | POA: Diagnosis not present

## 2017-11-10 DIAGNOSIS — I2510 Atherosclerotic heart disease of native coronary artery without angina pectoris: Secondary | ICD-10-CM | POA: Diagnosis present

## 2017-11-10 DIAGNOSIS — M25552 Pain in left hip: Secondary | ICD-10-CM | POA: Diagnosis present

## 2017-11-10 DIAGNOSIS — Z8249 Family history of ischemic heart disease and other diseases of the circulatory system: Secondary | ICD-10-CM | POA: Diagnosis not present

## 2017-11-10 DIAGNOSIS — I63341 Cerebral infarction due to thrombosis of right cerebellar artery: Secondary | ICD-10-CM | POA: Diagnosis present

## 2017-11-10 DIAGNOSIS — E663 Overweight: Secondary | ICD-10-CM | POA: Diagnosis present

## 2017-11-10 DIAGNOSIS — Z6827 Body mass index (BMI) 27.0-27.9, adult: Secondary | ICD-10-CM | POA: Diagnosis not present

## 2017-11-10 DIAGNOSIS — E785 Hyperlipidemia, unspecified: Secondary | ICD-10-CM | POA: Diagnosis not present

## 2017-11-10 DIAGNOSIS — I6300 Cerebral infarction due to thrombosis of unspecified precerebral artery: Secondary | ICD-10-CM | POA: Diagnosis not present

## 2017-11-10 DIAGNOSIS — R29700 NIHSS score 0: Secondary | ICD-10-CM | POA: Diagnosis present

## 2017-11-10 DIAGNOSIS — Z791 Long term (current) use of non-steroidal anti-inflammatories (NSAID): Secondary | ICD-10-CM | POA: Diagnosis not present

## 2017-11-10 DIAGNOSIS — Y92009 Unspecified place in unspecified non-institutional (private) residence as the place of occurrence of the external cause: Secondary | ICD-10-CM | POA: Diagnosis not present

## 2017-11-10 DIAGNOSIS — Z79890 Hormone replacement therapy (postmenopausal): Secondary | ICD-10-CM | POA: Diagnosis not present

## 2017-11-10 DIAGNOSIS — I6389 Other cerebral infarction: Secondary | ICD-10-CM | POA: Diagnosis not present

## 2017-11-10 DIAGNOSIS — I5030 Unspecified diastolic (congestive) heart failure: Secondary | ICD-10-CM

## 2017-11-10 DIAGNOSIS — Z79899 Other long term (current) drug therapy: Secondary | ICD-10-CM | POA: Diagnosis not present

## 2017-11-10 DIAGNOSIS — I351 Nonrheumatic aortic (valve) insufficiency: Secondary | ICD-10-CM | POA: Diagnosis not present

## 2017-11-10 DIAGNOSIS — G4700 Insomnia, unspecified: Secondary | ICD-10-CM | POA: Diagnosis present

## 2017-11-10 DIAGNOSIS — F419 Anxiety disorder, unspecified: Secondary | ICD-10-CM | POA: Diagnosis present

## 2017-11-10 DIAGNOSIS — R42 Dizziness and giddiness: Secondary | ICD-10-CM | POA: Diagnosis not present

## 2017-11-10 DIAGNOSIS — Y93K1 Activity, walking an animal: Secondary | ICD-10-CM | POA: Diagnosis not present

## 2017-11-10 DIAGNOSIS — I10 Essential (primary) hypertension: Secondary | ICD-10-CM | POA: Diagnosis not present

## 2017-11-10 DIAGNOSIS — E876 Hypokalemia: Secondary | ICD-10-CM | POA: Diagnosis not present

## 2017-11-10 LAB — HEMOGLOBIN A1C
Hgb A1c MFr Bld: 5.9 % — ABNORMAL HIGH (ref 4.8–5.6)
MEAN PLASMA GLUCOSE: 122.63 mg/dL

## 2017-11-10 LAB — CBC
HEMATOCRIT: 39.9 % (ref 36.0–46.0)
Hemoglobin: 13.2 g/dL (ref 12.0–15.0)
MCH: 30 pg (ref 26.0–34.0)
MCHC: 33.1 g/dL (ref 30.0–36.0)
MCV: 90.7 fL (ref 78.0–100.0)
Platelets: 311 10*3/uL (ref 150–400)
RBC: 4.4 MIL/uL (ref 3.87–5.11)
RDW: 13.7 % (ref 11.5–15.5)
WBC: 8.1 10*3/uL (ref 4.0–10.5)

## 2017-11-10 LAB — COMPREHENSIVE METABOLIC PANEL
ALBUMIN: 3.9 g/dL (ref 3.5–5.0)
ALBUMIN: 3.7 g/dL (ref 3.5–5.0)
ALT: 18 U/L (ref 14–54)
ALT: 14 U/L (ref 14–54)
ANION GAP: 14 (ref 5–15)
AST: 22 U/L (ref 15–41)
AST: 21 U/L (ref 15–41)
Alkaline Phosphatase: 94 U/L (ref 38–126)
Alkaline Phosphatase: 96 U/L (ref 38–126)
Anion gap: 14 (ref 5–15)
BUN: 16 mg/dL (ref 6–20)
BUN: 16 mg/dL (ref 6–20)
CHLORIDE: 100 mmol/L — AB (ref 101–111)
CO2: 23 mmol/L (ref 22–32)
CO2: 21 mmol/L — ABNORMAL LOW (ref 22–32)
Calcium: 10.2 mg/dL (ref 8.9–10.3)
Calcium: 9.9 mg/dL (ref 8.9–10.3)
Chloride: 102 mmol/L (ref 101–111)
Creatinine, Ser: 0.9 mg/dL (ref 0.44–1.00)
Creatinine, Ser: 0.89 mg/dL (ref 0.44–1.00)
GFR calc Af Amer: >60 mL/min (ref >60)
GFR calc Af Amer: >60 mL/min (ref >60)
GFR calc non Af Amer: >60 mL/min (ref >60)
GFR calc non Af Amer: >60 mL/min (ref >60)
GLUCOSE: 113 mg/dL — AB (ref 65–99)
GLUCOSE: 116 mg/dL — AB (ref 65–99)
POTASSIUM: 3.2 mmol/L — AB (ref 3.5–5.1)
Potassium: 3.4 mmol/L — ABNORMAL LOW (ref 3.5–5.1)
SODIUM: 137 mmol/L (ref 135–145)
Sodium: 137 mmol/L (ref 135–145)
Total Bilirubin: 0.6 mg/dL (ref 0.3–1.2)
Total Bilirubin: 0.5 mg/dL (ref 0.3–1.2)
Total Protein: 7.6 g/dL (ref 6.5–8.1)
Total Protein: 7.5 g/dL (ref 6.5–8.1)

## 2017-11-10 LAB — RAPID URINE DRUG SCREEN, HOSP PERFORMED
Amphetamines: NOT DETECTED
BARBITURATES: NOT DETECTED
Benzodiazepines: NOT DETECTED
Cocaine: NOT DETECTED
Opiates: NOT DETECTED
Tetrahydrocannabinol: NOT DETECTED

## 2017-11-10 LAB — ECHOCARDIOGRAM COMPLETE
HEIGHTINCHES: 68 in
Weight: 2945.35 oz

## 2017-11-10 LAB — LIPID PANEL
CHOL/HDL RATIO: 4 ratio
CHOLESTEROL: 236 mg/dL — AB (ref 0–200)
HDL: 59 mg/dL (ref >40)
LDL Cholesterol: 129 mg/dL — ABNORMAL HIGH (ref 0–99)
Triglycerides: 240 mg/dL — ABNORMAL HIGH (ref <150)
VLDL: 48 mg/dL — ABNORMAL HIGH (ref 0–40)

## 2017-11-10 MED ORDER — ACETAMINOPHEN 650 MG RE SUPP: 650.0000 mg | RECTAL | Status: DC | PRN

## 2017-11-10 MED ORDER — TRIAMTERENE-HCTZ 37.5-25 MG PO TABS
1.0000 | ORAL_TABLET | Freq: Every day | ORAL | Status: DC
  Administered 2017-11-10: 1 via ORAL
  Filled 2017-11-10: qty 1

## 2017-11-10 MED ORDER — ENOXAPARIN SODIUM 40 MG/0.4ML ~~LOC~~ SOLN
40.0000 mg | Freq: Every day | SUBCUTANEOUS | Status: DC
  Administered 2017-11-10: 40 mg via SUBCUTANEOUS
  Filled 2017-11-10: qty 0.4

## 2017-11-10 MED ORDER — IRBESARTAN 300 MG PO TABS
300.0000 mg | ORAL_TABLET | Freq: Every day | ORAL | Status: DC
  Administered 2017-11-10: 300 mg via ORAL
  Filled 2017-11-10: qty 1

## 2017-11-10 MED ORDER — MUSCLE RUB 10-15 % EX CREA
TOPICAL_CREAM | CUTANEOUS | Status: DC | PRN
  Administered 2017-11-10: 1 via TOPICAL
  Administered 2017-11-10: 12:00:00 via TOPICAL
  Filled 2017-11-10 (×2): qty 85

## 2017-11-10 MED ORDER — PRAVASTATIN SODIUM 40 MG PO TABS: 40.0000 mg | ORAL_TABLET | Freq: Every day | ORAL | Status: DC

## 2017-11-10 MED ORDER — SENNOSIDES-DOCUSATE SODIUM 8.6-50 MG PO TABS: 1.0000 | ORAL_TABLET | Freq: Every evening | ORAL | Status: DC | PRN

## 2017-11-10 MED ORDER — CLONAZEPAM 0.5 MG PO TABS: 0.5000 mg | ORAL_TABLET | Freq: Every day | ORAL | Status: DC | PRN

## 2017-11-10 MED ORDER — ACETAMINOPHEN 325 MG PO TABS
650.0000 mg | ORAL_TABLET | ORAL | Status: DC | PRN
  Administered 2017-11-10: 650 mg via ORAL
  Filled 2017-11-10: qty 2

## 2017-11-10 MED ORDER — ZOLPIDEM TARTRATE 5 MG PO TABS
10.0000 mg | ORAL_TABLET | Freq: Every evening | ORAL | Status: DC | PRN
Start: 2017-11-10 — End: 2017-11-10

## 2017-11-10 MED ORDER — ACETAMINOPHEN 160 MG/5ML PO SOLN: 650.0000 mg | ORAL | Status: DC | PRN

## 2017-11-10 MED ORDER — PRAVASTATIN SODIUM 40 MG PO TABS
80.0000 mg | ORAL_TABLET | Freq: Every day | ORAL | Status: DC
  Administered 2017-11-10: 80 mg via ORAL
  Filled 2017-11-10: qty 2

## 2017-11-10 MED ORDER — ATENOLOL 25 MG PO TABS
50.0000 mg | ORAL_TABLET | Freq: Every day | ORAL | Status: DC
  Administered 2017-11-10: 50 mg via ORAL
  Filled 2017-11-10: qty 2

## 2017-11-10 MED ORDER — POTASSIUM CHLORIDE CRYS ER 20 MEQ PO TBCR
20.0000 meq | EXTENDED_RELEASE_TABLET | Freq: Two times a day (BID) | ORAL | Status: DC
  Administered 2017-11-10 (×2): 20 meq via ORAL
  Filled 2017-11-10 (×2): qty 1

## 2017-11-10 MED ORDER — ZOLPIDEM TARTRATE 5 MG PO TABS
5.0000 mg | ORAL_TABLET | Freq: Every evening | ORAL | Status: DC | PRN
  Administered 2017-11-10: 5 mg via ORAL
  Filled 2017-11-10: qty 1

## 2017-11-10 MED ORDER — TRAMADOL HCL 50 MG PO TABS
50.0000 mg | ORAL_TABLET | Freq: Four times a day (QID) | ORAL | Status: DC | PRN
  Administered 2017-11-10 – 2017-11-11 (×3): 50 mg via ORAL
  Filled 2017-11-10 (×3): qty 1

## 2017-11-10 MED ORDER — STROKE: EARLY STAGES OF RECOVERY BOOK
Freq: Once | Status: AC
  Administered 2017-11-10: 07:00:00
  Filled 2017-11-10: qty 1

## 2017-11-10 MED ORDER — NORTRIPTYLINE HCL 25 MG PO CAPS
25.0000 mg | ORAL_CAPSULE | Freq: Every day | ORAL | Status: DC
  Administered 2017-11-10: 25 mg via ORAL
  Filled 2017-11-10: qty 1

## 2017-11-10 MED ORDER — ASPIRIN EC 81 MG PO TBEC
81.0000 mg | DELAYED_RELEASE_TABLET | Freq: Every day | ORAL | Status: DC
  Administered 2017-11-10: 81 mg via ORAL
  Filled 2017-11-10: qty 1

## 2017-11-10 NOTE — ED Notes (Signed)
Patient reports when walking the dog this morning she started to feel dizzy- states "really dizzy, like I never felt like that before" and then fell against the car and to the ground on her LEFT side. Reports hitting her head but denies LOC.

## 2017-11-10 NOTE — Progress Notes (Signed)
Patient admitted after midnight, please see H&P.  MRI + for CVA.  LDL >70-- patient on Pravachol but is resistant to change due to "arthritis".  Stroke neuro consult pending.  Eulogio Bear DO

## 2017-11-10 NOTE — Evaluation (Signed)
Occupational Therapy Evaluation Patient Details Name: Cassandra Bailey MRN: [id number] DOB: 10-Jun-1946 Today's Date: 11/10/2017    History of Present Illness Patient is a 72 y/o female who presents with sudden onset of dizziness resulting in fall onto left side. 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 PMH includes HTN, CAD.   Clinical Impression   This 72 y/o F presents with the above. Pt lives alone, at baseline is independent with ADLs, iADLs and functional mobility. Pt completed room and hallway functional mobility, toileting and standing grooming ADLs without AD and with supervision throughout; has available intermittent assist from family at time of discharge. Pt appears close to baseline regarding ADL and mobility completion; no further acute OT needs identified at this time. OT referral is appreciated, will sign off.      Follow Up Recommendations  No OT follow up;Supervision - Intermittent    Equipment Recommendations  None recommended by OT           Precautions / Restrictions Precautions Precautions: Fall Restrictions Weight Bearing Restrictions: No      Mobility Bed Mobility Overal bed mobility: Needs Assistance Bed Mobility: Supine to Sit     Supine to sit: Modified independent (Device/Increase time)     General bed mobility comments: No assist needed.   Transfers Overall transfer level: Needs assistance Equipment used: None Transfers: Sit to/from Stand Sit to Stand: Supervision         General transfer comment: Supervision for safety. Stood from EOB and toilet during session     Balance Overall balance assessment: Needs assistance Sitting-balance support: Feet supported;No upper extremity supported Sitting balance-Leahy Scale: Good     Standing balance support: During functional activity Standing balance-Leahy Scale: Good Standing balance comment: pt completing functional tasks in standing without LOB and supervision throughout                            ADL either performed or assessed with clinical judgement   ADL Overall ADL's : Needs assistance/impaired                                       General ADL Comments: pt completing standing grooming ADLs, toileting and functional mobility without AD and supervision throughout, easily able to reach and adjust socks at start of session; pt performing higher level cognitive tasks during hallway mobility with overall supervision and no significant LOB noted      Vision Baseline Vision/History: Cataracts Patient Visual Report: No change from baseline Vision Assessment?: No apparent visual deficits                Pertinent Vitals/Pain Pain Assessment: No/denies pain     Hand Dominance Right   Extremity/Trunk Assessment Upper Extremity Assessment Upper Extremity Assessment: Generalized weakness   Lower Extremity Assessment Lower Extremity Assessment: Defer to PT evaluation       Communication Communication Communication: No difficulties   Cognition Arousal/Alertness: Awake/alert Behavior During Therapy: WFL for tasks assessed/performed Overall Cognitive Status: Within Functional Limits for tasks assessed                                                      Home Living Family/patient expects to be  discharged to:: Private residence Living Arrangements: Alone Available Help at Discharge: Family;Friend(s);Available PRN/intermittently Type of Home: House Home Access: Stairs to enter [employer] of Steps: 3 Entrance Stairs-Rails: Right Home Layout: One level     Bathroom Shower/Tub: Tub/shower unit;Walk-in shower   Bathroom Toilet: Standard            Lives With: Alone    Prior Functioning/Environment Level of Independence: Independent        Comments: Drives, works in [employer] 3 days/week, walks dogs. Does IADLs.        OT Problem List: Decreased activity tolerance             OT Goals(Current goals can be found in the care plan section) Acute Rehab OT Goals Patient Stated Goal: to go home OT Goal Formulation: All assessment and education complete, DC therapy   AM-PAC PT "6 Clicks" Daily Activity     Outcome Measure Help from another person eating meals?: None Help from another person taking care of personal grooming?: None Help from another person toileting, which includes using toliet, bedpan, or urinal?: None Help from another person bathing (including washing, rinsing, drying)?: None Help from another person to put on and taking off regular upper body clothing?: None Help from another person to put on and taking off regular lower body clothing?: None 6 Click Score: 24   End of Session Equipment Utilized During Treatment: Gait belt Nurse Communication: Mobility status  Activity Tolerance: Patient tolerated treatment well Patient left: in bed;with call bell/phone within reach;with bed alarm set;with family/visitor present  OT Visit Diagnosis: Other symptoms and signs involving the nervous system (R29.898)                Time: 7680-8811 OT Time Calculation (min): 16 min Charges:  OT General Charges $OT Visit: 1 Visit OT Evaluation $OT Eval Low Complexity: 1 Low G-Codes:     Lou Cal, OT Pager [phone] 11/10/2017   Raymondo Band 11/10/2017, 5:17 PM

## 2017-11-10 NOTE — Evaluation (Signed)
Physical Therapy Evaluation Patient Details Name: Cassandra Bailey MRN: [id number] DOB: [birth year] Today's Date: 11/10/2017   History of Present Illness  Patient is a 72 y/o female who presents with sudden onset of dizziness resulting in fall onto left side. 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 PMH includes HTN, CAD.  Clinical Impression  Patient presents with mild balance deficits s/p above. Tolerated gait training and stair training with supervision for safety. Only mild instability noted with higher level balance activities. Scored 20/24 on DGI indicating pt not increased risk for falls. Pt lives alone and independent PTA. Education re: sign/symptoms of stroke and Be[employer]. Pt does not require skilled therapy services as pt functioning close to baseline. Has supportive son. Discharge from therapy.    Follow Up Recommendations No PT follow up;Supervision - Intermittent    Equipment Recommendations  None recommended by PT    Recommendations for Other Services       Precautions / Restrictions Precautions Precautions: Fall Restrictions Weight Bearing Restrictions: No      Mobility  Bed Mobility Overal bed mobility: Needs Assistance Bed Mobility: Supine to Sit     Supine to sit: Modified independent (Device/Increase time)     General bed mobility comments: No assist needed.   Transfers Overall transfer level: Needs assistance Equipment used: None Transfers: Sit to/from Stand Sit to Stand: Supervision         General transfer comment: Supervision for safety. Stood from [employer], from toilet x1.   Ambulation/Gait Ambulation/Gait assistance: Min guard Ambulation Distance (Feet): 200 Feet Assistive device: None Gait Pattern/deviations: Step-through pattern;Decreased stride length;Antalgic;Drifts right/left Gait velocity: Decreased   General Gait Details: Slow, antalgic gait with mild instability noted. RLE in ER- reports hx of hip arthritis. MIld drifting but  no overt LOB.  Stairs Stairs: Yes Stairs assistance: Supervision Stair Management: Alternating pattern;Two rails Number of Stairs: 3(+ 2 steps x2 bouts) General stair comments: Cues for technique and safety.   Wheelchair Mobility    Modified Rankin (Stroke Patients Only) Modified Rankin (Stroke Patients Only) Pre-Morbid Rankin Score: Slight disability Modified Rankin: Slight disability     Balance Overall balance assessment: Needs assistance Sitting-balance support: Feet supported;No upper extremity supported Sitting balance-Leahy Scale: Good Sitting balance - Comments: Able to donn jeans sitting in chair without difficulty.    Standing balance support: During functional activity Standing balance-Leahy Scale: Good Standing balance comment: Able to pull up pants without LOB and perform pericare in standing.              High level balance activites: Backward walking;Direction changes;Turns;Sudden stops;Head turns High Level Balance Comments: Tolerated above with only mild deviations in gait but no overt LOB. Standardized Balance Assessment Standardized Balance Assessment : Dynamic Gait Index   Dynamic Gait Index Level Surface: Mild Impairment Change in Gait Speed: Mild Impairment Gait with Horizontal Head Turns: Normal Gait with Vertical Head Turns: Normal Gait and Pivot Turn: Normal Step Over Obstacle: Mild Impairment Step Around Obstacles: Normal Steps: Mild Impairment Total Score: 20       Pertinent Vitals/Pain Pain Assessment: Faces Faces Pain Scale: Hurts even more Pain Location: left side of ribs Pain Descriptors / Indicators: Sore;Aching Pain Intervention(s): Monitored during session;Repositioned    Home Living Family/patient expects to be discharged to:: Private residence Living Arrangements: Alone Available Help at Discharge: Family;Available PRN/intermittently Type of Home: House Home Access: Stairs to enter Entrance Stairs-Rails:  Right Entrance Stairs-Number of Steps: 3 Home Layout: One level  Prior Function Level of Independence: Independent         Comments: Drives, works in [employer] 3 days/week, walks dogs. Does IADLs.     Hand Dominance        Extremity/Trunk Assessment   Upper Extremity Assessment Upper Extremity Assessment: Defer to OT evaluation    Lower Extremity Assessment Lower Extremity Assessment: Generalized weakness       Communication   Communication: No difficulties  Cognition Arousal/Alertness: Awake/alert Behavior During Therapy: WFL for tasks assessed/performed Overall Cognitive Status: Within Functional Limits for tasks assessed                                        General Comments      Exercises     Assessment/Plan    PT Assessment Patent does not need any further PT services  PT Problem List         PT Treatment Interventions      PT Goals (Current goals can be found in the Care Plan section)  Acute Rehab PT Goals Patient Stated Goal: to go home PT Goal Formulation: All assessment and education complete, DC therapy    Frequency     Barriers to discharge        Co-evaluation               AM-PAC PT "6 Clicks" Daily Activity  Outcome Measure Difficulty turning over in bed (including adjusting bedclothes, sheets and blankets)?: None Difficulty moving from lying on back to sitting on the side of the bed? : None Difficulty sitting down on and standing up from a chair with arms (e.g., wheelchair, bedside commode, etc,.)?: None Help needed moving to and from a bed to chair (including a wheelchair)?: None Help needed walking in hospital room?: A Little Help needed climbing 3-5 steps with a railing? : A Little 6 Click Score: 22    End of Session Equipment Utilized During Treatment: Gait belt Activity Tolerance: Patient tolerated treatment well Patient left: Other (comment)(sitting in w/c to go down with transport to  test. ) Nurse Communication: Mobility status PT Visit Diagnosis: Difficulty in walking, not elsewhere classified (R26.2);Unsteadiness on feet (R26.81)    Time: 2025-4270 PT Time Calculation (min) (ACUTE ONLY): 27 min   Charges:   PT Evaluation $PT Eval Low Complexity: 1 Low PT Treatments $Gait Training: 8-22 mins   PT G Codes:        Wray Kearns, PT, DPT [phone]    Marguarite Arbour A Aristides Luckey 11/10/2017, 9:28 AM

## 2017-11-10 NOTE — Progress Notes (Signed)
STROKE TEAM PROGRESS NOTE   HISTORY OF PRESENT ILLNESS (per record) Cassandra Bailey is an 72 y.o. female past medical history of hypertension, hyperlipidemia, anxiety and migraine presents to the hospital after having sudden onset episode of dizziness and gait imbalance at 8:15 in the morning while she was walking her dog. She states that symptoms occur suddenly, denies sensation of spinning, but just felt off balance. She leaned against her car on the left side and then had a fall resulting in pain in her hip. She walked back to her house and and laid in bed for 3 hours. Her son encouraged her to come to the hospital. She denies any ringing sensation ears, slurred speech, double vision.She feels completely back to herself. CT head showed decreased attenuation in the left medial pons-midbrain.  Date last known well: 3.17.19 Time last known well: 8.15 am tPA Given: symptoms resolved NIHSS: 0 Baseline MRS 0   SUBJECTIVE (INTERVAL HISTORY) Her daughter and son are  at the bedside.   Patient states her dizziness and imbalance are much improved. She has no prior history of strokes or TIAs. She denies any history of atrial fibrillation or arrhythmias.    OBJECTIVE Temp:  [97.4 F (36.3 C)-98.5 F (36.9 C)] 98.2 F (36.8 C) (03/18 0530) Pulse Rate:  [77-84] 79 (03/18 0730) Cardiac Rhythm: Normal sinus rhythm (03/18 0730) Resp:  [18-19] 18 (03/18 0730) BP: (128-146)/(68-91) 128/68 (03/18 0730) SpO2:  [95 %-98 %] 95 % (03/18 0730) Weight:  [184 lb 1.4 oz (83.5 kg)-194 lb (88 kg)] 184 lb 1.4 oz (83.5 kg) (03/18 0330)  CBC:  Recent Labs  Lab 11/09/17 2328 11/10/17 0228  WBC 9.2 8.1  HGB 14.2 13.2  HCT 41.2 39.9  MCV 90.2 90.7  PLT 317 169    Basic Metabolic Panel:  Recent Labs  Lab 11/09/17 2328 11/10/17 0228  NA 137 137  K 3.4* 3.2*  CL 100* 102  CO2 23 21*  GLUCOSE 113* 116*  BUN 16 16  CREATININE 0.90 0.89  CALCIUM 10.2 9.9    Lipid Panel:     Component Value  Date/Time   CHOL 236 (H) 11/10/2017 0228   TRIG 240 (H) 11/10/2017 0228   HDL 59 11/10/2017 0228   CHOLHDL 4.0 11/10/2017 0228   VLDL 48 (H) 11/10/2017 0228   LDLCALC 129 (H) 11/10/2017 0228   HgbA1c:  Lab Results  Component Value Date   HGBA1C 5.9 (H) 11/10/2017   Urine Drug Screen:     Component Value Date/Time   LABOPIA NONE DETECTED 11/09/2017 2351   COCAINSCRNUR NONE DETECTED 11/09/2017 2351   LABBENZ NONE DETECTED 11/09/2017 2351   AMPHETMU NONE DETECTED 11/09/2017 2351   THCU NONE DETECTED 11/09/2017 2351   LABBARB NONE DETECTED 11/09/2017 2351    Alcohol Level No results found for: ETH  IMAGING  Dg Ribs Unilateral W/chest Left 11/09/2017 IMPRESSION:  1. No visualized rib fracture or pulmonary complication.  2. Minimal subsegmental left basilar atelectasis.   Ct Head Wo Contrast 11/09/2017 IMPRESSION:  Age uncertain but potentially recent infarct in the medial left pons-midbrain junction. There is slight periventricular small vessel disease. Elsewhere gray-white compartments appear normal. No mass or hemorrhage. There are foci of arterial vascular calcification.   Mr Cassandra Bailey Head Wo Contrast 11/10/2017 IMPRESSION:   MRI HEAD IMPRESSION: 1. Patchy small volume acute ischemic nonhemorrhagic infarcts involving the inferior right cerebellar hemisphere, right PICA territory.  2. Age-related cerebral atrophy with moderate chronic small vessel ischemic disease.   MRA  HEAD IMPRESSION:  Negative intracranial MRA. No large vessel occlusion. No high-grade proximal or correctable stenosis.    Dg Hip Unilat With Pelvis 2-3 Views Left 11/09/2017 IMPRESSION:  1. No acute osseous abnormality about the left hip.  2. Moderate degenerative osteoarthrosis.      Transthoracic Echocardiogram - pending 00/00/00    Bilateral Carotid Dopplers - no significant extracranial stenosis.       PHYSICAL EXAM Vitals:   11/10/17 0039 11/10/17 0330 11/10/17 0530 11/10/17 0730   BP: 133/81 (!) 145/78 133/72 128/68  Pulse: 82 77 77 79  Resp: 18 18 18 18   Temp: 98.1 F (36.7 C) (!) 97.4 F (36.3 C) 98.2 F (36.8 C)   TempSrc: Oral Oral Oral   SpO2: 97% 98% 97% 95%  Weight: 194 lb (88 kg) 184 lb 1.4 oz (83.5 kg)    Height: 5\' 8"  (1.727 m) 5\' 8"  (1.727 m)     Pleasant elderly Caucasian lady currently not in distress. . Afebrile. Head is nontraumatic. Neck is supple without bruit.    Cardiac exam no murmur or gallop. Lungs are clear to auscultation. Distal pulses are well felt.  Neurological Exam ;  Awake  Alert oriented x 3. Normal speech and language.eye movements full without nystagmus.fundi were not visualized. Vision acuity and fields appear normal. Hearing is normal. Palatal movements are normal. Face symmetric. Tongue midline. Normal strength, tone, reflexes and coordination. Normal sensation. Gait deferred.   HOME MEDICATIONS:  No medications prior to admission.      HOSPITAL MEDICATIONS:  . aspirin EC  81 mg Oral Daily  . atenolol  50 mg Oral Daily  . enoxaparin (LOVENOX) injection  40 mg Subcutaneous Daily  . irbesartan  300 mg Oral Daily  . nortriptyline  25 mg Oral QHS  . pravastatin  40 mg Oral q1800  . triamterene-hydrochlorothiazide  1 tablet Oral Daily     ASSESSMENT/PLAN [name] is a 72 y.o. female with history of hypertension, CAD, hyperlipidemia, anxiety and migraine  presenting with dizziness and gait disturbance. She did not receive IV t-PA due to resolution of deficits.   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  Resultant  Mild dizzinessCT head - potentially recent infarct in the medial left pons-midbrain junction.  MRI head - Patchy small volume acute ischemic nonhemorrhagic infarcts involving the inferior right cerebellar hemisphere, right PICA territory.   MRA head - Negative intracranial MRA.  Carotid Doppler - no significant extracranial stenosis  2D Echo - pending  LDL -  129  HgbA1c - 5.9  VTE prophylaxis - SCDs Fall precautions Diet Heart Room service appropriate? Yes; Fluid consistency: Thin  No antithrombotic prior to admission, now on aspirin 81 mg daily  Patient counseled to be compliant with her antithrombotic medications  Ongoing aggressive stroke risk factor management  Therapy recommendations:  No PT follow-up recommended.  Disposition:  Pending  Hypertension  Stable  Permissive hypertension (OK if < 220/120) but gradually normalize in 5-7 days  Long-term BP goal normotensive  Hyperlipidemia  Lipid lowering medication PTA:  none  LDL 129, goal < 70  Current lipid lowering medication: Pravachol 40 mg daily  Continue statin at discharge    Other Stroke Risk Factors  Advanced age  ETOH use, advised to drink no more than 1 drink per day.  Overweight, Body mass index is 27.99 kg/m., recommend weight loss, diet and exercise as appropriate   Coronary artery disease   Other Active Problems  Hypokalemia -  3.2 -supplement and recheck   Plan / Recommendations   Stroke workup -echo  s pending.  TEE and possible loop Tuesday. Cardiology notified. Discussed with patient possible participation in the new stroke prevention trial but Is not interested. Long discussion with the patient, son and daughter and answered questions about her stroke and workup.Greater than 50% time during this 35 minute visit was spent on counseling and coordination of care about her embolic stroke and answered questions. Discussed with Dr. Leroy Kennedy day # 0  Mikey Bussing PA-C [facility] Pager [phone] 11/10/2017, 11:53 AM   To contact Stroke Continuity provider, please refer to [email or web address] After hours, contact General Neurology

## 2017-11-10 NOTE — Evaluation (Signed)
Speech Language Pathology Evaluation Patient Details Name: Cassandra Bailey MRN: [id number] DOB: 04/17/1946 Today's Date: 11/10/2017 Time: 4098-1191 SLP Time Calculation (min) (ACUTE ONLY): 36 min  Problem List:  Patient Active Problem List   Diagnosis Date Noted  . Stroke [facility]) 11/10/2017   Past Medical History:  Past Medical History:  Diagnosis Date  . Coronary artery disease   . Hypertension    Past Surgical History: History reviewed. No pertinent surgical history. HPI:  72 year old woman with medical problems including long-standing hypertension, hyperlipidemia, insomnia, anxiety and history of migraine headaches. She reports developing acute onset of dizziness this morning at approximately 8:15 AM, she describes as an unsteadiness. She reports it lasted about 10 minutes, never experienced anything like this before. She specifically denies the sensation as being vertiginous or lightheadedness. She had to lean against her car and ultimately crawled back inside her house.  She did suffer a fall, had some impact her left hip and ribs 11/10/17 MRI head indicated Patchy small volume acute ischemic nonhemorrhagic infarcts involving the inferior right cerebellar hemisphere, right PICA territory.  Assessment / Plan / Recommendation Clinical Impression   Pt administered MOCA (Montreal Cognitive Assessment) with results indicating 26/30 which is within normal range for scoring and only deficits noted were in the area of memory with word recall after time delay; pt was able to recall 1/5 and expressed that this was "different than her normal ability."  [facility] SLP may be indicated to assess memory further prn; other areas of MOCA were all within functional limits, but given MRI results, this may be warranted if any further symptoms persist within her home environment given pt lives alone; ST will s/o within this setting, but [facility] SLP consult may be beneficial if pt experiences any difficulty transitioning  home; thank you for this consult.    SLP Assessment  SLP Recommendation/Assessment: Patient does not need any further Speech Language Pathology Services SLP Visit Diagnosis: Other (comment)    Follow Up Recommendations  Other (comment)([facility] SLP prn to assess memory )    Frequency and Duration   n/a        SLP Evaluation Cognition  Overall Cognitive Status: Within Functional Limits for tasks assessed Arousal/Alertness: Awake/alert Orientation Level: Oriented X4 Memory: Impaired Memory Impairment: Retrieval deficit Awareness: Appears intact Problem Solving: Appears intact Safety/Judgment: Appears intact       Comprehension  Auditory Comprehension Overall Auditory Comprehension: Appears within functional limits for tasks assessed Yes/No Questions: Within Functional Limits Commands: Within Functional Limits Conversation: Complex Visual Recognition/Discrimination Discrimination: Within Function Limits Reading Comprehension Reading Status: Within funtional limits    Expression Expression Primary Mode of Expression: Verbal Verbal Expression Overall Verbal Expression: Appears within functional limits for tasks assessed Level of Generative/Spontaneous Verbalization: Conversation Repetition: No impairment Naming: No impairment Pragmatics: No impairment Non-Verbal Means of Communication: Not applicable Written Expression Dominant Hand: Right Written Expression: Within Functional Limits   Oral / Motor  Oral Motor/Sensory Function Overall Oral Motor/Sensory Function: Within functional limits Motor Speech Overall Motor Speech: Appears within functional limits for tasks assessed Respiration: Within functional limits Phonation: Normal Resonance: Within functional limits Articulation: Within functional limitis Intelligibility: Intelligible Motor Planning: Witnin functional limits                      Elvina Sidle, M.S., CCC-SLP 11/10/2017, 4:35 PM

## 2017-11-10 NOTE — H&P (Signed)
History and Physical   Cassandra Bailey LKG:[id number] DOB: [birth year] DOA: 11/09/2017  PCP: Merrilee Seashore, MD  Chief Complaint: dizziness  HPI: this 72 year old woman with medical problems including long-standing hypertension, hyperlipidemia, insomnia, anxiety and history of migraine headaches. She reports developing acute onset of dizziness this morning at approximately 8:15 AM, she describes as an unsteadiness. She reports it lasted about 10 minutes, never experienced anything like this before. She specifically denies the sensation as being vertiginous or lightheadedness. She had to lean against her car and ultimately crawled back inside her house.  She did suffer a fall, had some impact her left hip and ribs. Mild discomfort noted. She went to bed and rested for a few hours, sites around midday she went about her usual activities. Her son encouraged her to seek medical attention therefore she did so.  She denies ever having history of stroke or TIA in the past, specifically denies any history of atrial fibrillation. She reports being separated from her husband and lives alone with her pets. She works 3 days a week in the [employer]. She's never smoker, does not drink alcohol. She enjoys spending time with family, she is accompanied by her son.  She reports some nausea associated with her symptoms. She denies any diplopia, weakness, numbness, fatigue, chest pain, shortness of breath. She reports that her blood pressure management has waxed and waned, reports that most the time her systolic blood pressures in the 130s.  ED Course: in emergency department systolic blood pressure with systolics to 664Q, normal heart rate. CMP and CBC are unremarkable. UDS negative. EKG sinus rhythm.  CT of the head without contrast revealed age uncertain infarct in the medial left pons midbrain junction. No hemorrhage. Hospital medicine was consulted for further management.  Review of Systems: A complete  ROS was obtained; pertinent positives negatives are denoted in the HPI. Otherwise, all systems are negative.   Past Medical History:  Diagnosis Date  . Coronary artery disease   . Hypertension    Social History   Socioeconomic History  . Marital status: Divorced    Spouse name: Not on file  . Number of children: Not on file  . Years of education: Not on file  . Highest education level: Not on file  Social Needs  . Financial resource strain: Not on file  . Food insecurity - worry: Not on file  . Food insecurity - inability: Not on file  . Transportation needs - medical: Not on file  . Transportation needs - non-medical: Not on file  Occupational History  . Not on file  Tobacco Use  . Smoking status: Never Smoker  . Smokeless tobacco: Never Used  Substance and Sexual Activity  . Alcohol use: Yes  . Drug use: No  . Sexual activity: Not on file  Other Topics Concern  . Not on file  Social History Narrative  . Not on file   History reviewed. No pertinent family history.   Family hx: her father died at age 26 from cholangiocarcinoma, mother died at 55 from palpitations of dementia and Parkinson's disease.  Physical Exam: Vitals:   11/09/17 2105 11/10/17 0030 11/10/17 0039  BP: (!) 146/91  133/81  Pulse: 84  82  Resp: 19  18  Temp: 98.5 F (36.9 C) 98.1 F (36.7 C) 98.1 F (36.7 C)  TempSrc: Oral  Oral  SpO2: 96%  97%  Weight:   88 kg (194 lb)  Height:   5\' 8"  (1.727 m)  General: Appears calm and comfortable, white woman, no acute distress. ENT: Grossly normal hearing, MMM. Cardiovascular: RRR. No M/R/G. No LE edema.  Respiratory: CTA bilaterally. No wheezes or crackles. Normal respiratory effort. Breathing room air. Abdomen: Soft, non-tender. Bowel sounds present.  Skin: No rash or induration seen on limited exam. Musculoskeletal: Grossly normal tone BUE/BLE. Appropriate ROM.  Psychiatric: Grossly normal mood and affect. Neurologic: Moves all extremities in  coordinated fashion.  RAM intact. Finger to nose and heel to shin normal. Walks with normal gait. Tandem walk intact. Cardinal eye movements / gaze normal. Biceps and patellar reflexes symmetric. Proximal strength upper and lower extremities preserved. CN 2-12 intact.  Romberg test normal.  I have personally reviewed the following labs, culture data, and imaging studies.  Assessment/Plan:  #CVA of medial left pons-midbrain junction Course: patient with onset of symptoms at 8:15a in the AM on 11/09/2017; symptoms included acute onset dizziness / unsteadiness causing fall, resolved within 15 minutes. Assessment: CT head wo contrast reveals evidence if ischemic stroke, no appreciable deficits noted on my exam Plan: -have discussed case with neurology consult service, consult placed -imaging: MR of brain, MRA of neck, TTE, and telemetry to eval for any contributing arrhythmia -checking lipid profile and A1c with AM labs -start ASA in AM for secondary stroke prevention  #Other problems -Hypertension: on BB, ARB, and combo diuretic regimen - will continue for now, hold off on further agents in the acute setting -Insomnia: reports taking zolpidem chronically, reduced dose to 5 mg -Anxiety: continue benzodiazepine prn, patient reports taking during the daytime hours -Hx of migraine headaches: continue TCA qhs, patient reports HA frequency has decreased  DVT prophylaxis: Subq Lovenox Code Status: full code Disposition Plan: Anticipate D/C home in 2-5 days Consults called: neurology Admission status: admit to telemetry floor   Cheri Rous, MD [facility] Page:[phone]  If 7PM-7AM, please contact night-coverage [email or web address] Password [email or web address]

## 2017-11-10 NOTE — Consult Note (Signed)
Requesting Physician: Antonietta Breach [facility]    Chief Complaint: Sudden onset gait imbalance  History obtained from: Patient and Chart     HPI:                                                                                                                                       Cassandra Bailey is an 72 y.o. female past medical history of hypertension, hyperlipidemia, anxiety and migraine presents to the hospital after having sudden onset episode of dizziness and gait imbalance at 8:15 in the morning while she was walking her dog. She states that symptoms occur suddenly, denies sensation of spinning, but just felt off balance. She leaned against her car on the left side and then had a fall resulting in pain in her hip. She walked back to her house and and laid in bed for 3 hours. Her son encouraged her to come to the hospital. She denies any ringing sensation ears, slurred speech, double vision.She feels completely back to herself. CT head showed decreased attenuation in the left medial pons-midbrain.  Date last known well: 3.17.19 Time last known well: 8.15 am tPA Given: symptoms resolved NIHSS: 0 Baseline MRS 0    Past Medical History:  Diagnosis Date  . Coronary artery disease   . Hypertension     History reviewed. No pertinent surgical history.  History reviewed. No pertinent family history. Social History:  reports that  has never smoked. she has never used smokeless tobacco. She reports that she drinks alcohol. She reports that she does not use drugs.  Allergies:  Allergies  Allergen Reactions  . Codeine Nausea And Vomiting    Medications:                                                                                                                        I reviewed home medications   ROS:  14 systems reviewed and negative except above     Examination:                                                                                                      General: Appears well-developed and well-nourished.  Psych: Affect appropriate to situation Eyes: No scleral injection HENT: No OP obstrucion Head: Normocephalic.  Cardiovascular: Normal rate and regular rhythm.  Respiratory: Effort normal and breath sounds normal to anterior ascultation GI: Soft.  No distension. There is no tenderness.  Skin: WDI   Neurological Examination Mental Status: Alert, oriented, thought content appropriate.  Speech fluent without evidence of aphasia. Able to follow 3 step commands without difficulty. Cranial Nerves: II: Visual fields grossly normal,  III,IV, VI: ptosis not present, extra-ocular motions intact bilaterally, pupils equal, round, reactive to light and accommodation V,VII: smile symmetric, facial light touch sensation normal bilaterally VIII: hearing normal bilaterally IX,X: uvula rises symmetrically XI: bilateral shoulder shrug XII: midline tongue extension Motor: Right : Upper extremity   5/5    Left:     Upper extremity   5/5  Lower extremity   5/5     Lower extremity   5/5 Tone and bulk:normal tone throughout; no atrophy noted Sensory: Pinprick and light touch intact throughout, bilaterally Deep Tendon Reflexes: 2+ and symmetric throughout Plantars: Right: downgoing   Left: downgoing Cerebellar: normal finger-to-nose, normal rapid alternating movements and normal heel-to-shin test Gait: normal gait and station     Lab Results: Basic Metabolic Panel: Recent Labs  Lab 11/09/17 2328 11/10/17 0228  NA 137 137  K 3.4* 3.2*  CL 100* 102  CO2 23 21*  GLUCOSE 113* 116*  BUN 16 16  CREATININE 0.90 0.89  CALCIUM 10.2 9.9    CBC: Recent Labs  Lab 11/09/17 2328 11/10/17 0228  WBC 9.2 8.1  HGB 14.2 13.2  HCT 41.2 39.9  MCV 90.2 90.7  PLT 317 311    Coagulation Studies: Recent Labs    11/09/17 [id number]   LABPROT 12.4  INR 0.93    Imaging: Dg Ribs Unilateral W/chest Left  Result Date: 11/09/2017 CLINICAL DATA:  Fall at home in garage onto concrete floor. Left rib pain. EXAM: LEFT RIBS AND CHEST - 3+ VIEW COMPARISON:  None. FINDINGS: No evidence fracture or other bone lesions are seen involving the ribs. There is no evidence of pneumothorax or pleural effusion. Minimal subsegmental atelectasis at the left lung base. Heart size and mediastinal contours are within normal limits. IMPRESSION: 1. No visualized rib fracture or pulmonary complication. 2. Minimal subsegmental left basilar atelectasis. Electronically Signed   By: Jeb Levering M.D.   On: 11/09/2017 21:58   Ct Head Wo Contrast  Result Date: 11/09/2017 CLINICAL DATA:  Vertigo with fall EXAM: CT HEAD WITHOUT CONTRAST TECHNIQUE: Contiguous axial images were obtained from the base of the skull through the vertex without intravenous contrast. COMPARISON:  None. FINDINGS: Brain: There is age related volume loss. There is no intracranial mass, hemorrhage, extra-axial fluid collection, or midline shift. There is decreased attenuation in the left medial pons-midbrain junction  consistent with an age uncertain and potentially recent infarct. Elsewhere there is slight small vessel disease in the centra semiovale bilaterally. Vascular: There are no hyperdense vessels. There is calcification in each carotid siphon. Skull: Bony calvarium appears intact. Sinuses/Orbits: Paranasal sinuses are clear. Orbits appear symmetric bilaterally. Other: Mastoid air cells are clear. IMPRESSION: Age uncertain but potentially recent infarct in the medial left pons-midbrain junction. There is slight periventricular small vessel disease. Elsewhere gray-white compartments appear normal. No mass or hemorrhage. There are foci of arterial vascular calcification. Electronically Signed   By: Lowella Grip III M.D.   On: 11/09/2017 23:49   Dg Hip Unilat With Pelvis 2-3 Views  Left  Result Date: 11/09/2017 CLINICAL DATA:  Initial evaluation for acute trauma, fall. EXAM: DG HIP (WITH OR WITHOUT PELVIS) 2-3V LEFT COMPARISON:  None. FINDINGS: No acute fracture or dislocation. Femoral heads in normal alignment within the acetabula. Femoral head height preserved. Bony pelvis intact. SI joints approximated. Moderate osteoarthritic changes present about the hips bilaterally, left worse than right. No acute soft tissue abnormality. Degenerative changes noted within lower lumbar spine. IMPRESSION: 1. No acute osseous abnormality about the left hip. 2. Moderate degenerative osteoarthrosis. Electronically Signed   By: Jeannine Boga M.D.   On: 11/09/2017 21:59     ASSESSMENT AND PLAN  72 y.o. female past medical history of hypertension, hyperlipidemia, anxiety and migraine presents to the hospital after having sudden onset episode of dizziness and gait imbalance at 8:15 am last for about 10 minutes. CT head shows age indeterminate infarct in the pons. She is admitted for TIA/stroke  Workup.   Transient ischemic attack vs acute ischemic stroke   Recommend # MRI of the brain without contrast #MRA Head and neck  #Transthoracic Echo  # Start patient on ASA 325mg  daily #Start or continue Atorvastatin 80 mg/other high intensity statin # BP goal: permissive HTN upto 620 systolic, PRNs above 21 # HBAIC and Lipid profile # Telemetry monitoring # Frequent neuro checks # NPO until passes stroke swallow screen  Please page stroke NP  Or  PA  Or MD from 8am -4 pm  as this patient from this time will be  followed by the stroke.   You can look them up on [email or web address]  Password [facility]   Afrah Burlison [facility] Pager Number [phone]

## 2017-11-10 NOTE — Progress Notes (Signed)
    [facility] has been requested to perform a transesophageal echocardiogram on Cassandra Bailey for stroke.  After careful review of history and examination, the risks and benefits of transesophageal echocardiogram have been explained including risks of esophageal damage, perforation (1:10,000 risk), bleeding, pharyngeal hematoma as well as other potential complications associated with conscious sedation including aspiration, arrhythmia, respiratory failure and death. Alternatives to treatment were discussed, questions were answered. Patient is willing to proceed.   The procedure is scheduled for 11/11/16 at 3:30 PM with Dr. Acie Fredrickson.  Daune Perch, NP  11/10/2017 2:49 PM

## 2017-11-10 NOTE — Progress Notes (Signed)
  Echocardiogram 2D Echocardiogram has been performed.  Cassandra Bailey T Lurine Imel 11/10/2017, 11:14 AM

## 2017-11-11 ENCOUNTER — Encounter (HOSPITAL_COMMUNITY)
Admission: EM | Disposition: A | Discharge status: Home / Self Care | Payer: Self-pay | Source: Home / Self Care | Attending: Internal Medicine

## 2017-11-11 ENCOUNTER — Encounter (HOSPITAL_COMMUNITY): Payer: Self-pay

## 2017-11-11 ENCOUNTER — Inpatient Hospital Stay (HOSPITAL_COMMUNITY): Payer: Medicare Other

## 2017-11-11 ENCOUNTER — Ambulatory Visit (HOSPITAL_COMMUNITY): Admit: 2017-11-11 | Payer: Federal, State, Local not specified - PPO | Admitting: Cardiology

## 2017-11-11 DIAGNOSIS — I6300 Cerebral infarction due to thrombosis of unspecified precerebral artery: Secondary | ICD-10-CM

## 2017-11-11 DIAGNOSIS — I6389 Other cerebral infarction: Secondary | ICD-10-CM

## 2017-11-11 DIAGNOSIS — E785 Hyperlipidemia, unspecified: Secondary | ICD-10-CM

## 2017-11-11 DIAGNOSIS — E876 Hypokalemia: Secondary | ICD-10-CM

## 2017-11-11 DIAGNOSIS — I351 Nonrheumatic aortic (valve) insufficiency: Secondary | ICD-10-CM

## 2017-11-11 DIAGNOSIS — I10 Essential (primary) hypertension: Secondary | ICD-10-CM

## 2017-11-11 HISTORY — PX: TEE WITHOUT CARDIOVERSION: SHX5443

## 2017-11-11 HISTORY — PX: LOOP RECORDER INSERTION: EP1214

## 2017-11-11 LAB — BASIC METABOLIC PANEL
Anion gap: 13 (ref 5–15)
BUN: 20 mg/dL (ref 6–20)
CO2: 25 mmol/L (ref 22–32)
Calcium: 8.9 mg/dL (ref 8.9–10.3)
Chloride: 100 mmol/L — ABNORMAL LOW (ref 101–111)
Creatinine, Ser: 0.84 mg/dL (ref 0.44–1.00)
Glucose, Bld: 108 mg/dL — ABNORMAL HIGH (ref 65–99)
Potassium: 3.4 mmol/L — ABNORMAL LOW (ref 3.5–5.1)
SODIUM: 138 mmol/L (ref 135–145)

## 2017-11-11 SURGERY — ECHOCARDIOGRAM, TRANSESOPHAGEAL
Anesthesia: Moderate Sedation

## 2017-11-11 SURGERY — LOOP RECORDER INSERTION

## 2017-11-11 MED ORDER — BUTAMBEN-TETRACAINE-BENZOCAINE 2-2-14 % EX AERO
INHALATION_SPRAY | CUTANEOUS | Status: DC | PRN
  Administered 2017-11-11: 2 via TOPICAL

## 2017-11-11 MED ORDER — LIDOCAINE-EPINEPHRINE 1 %-1:100000 IJ SOLN
INTRAMUSCULAR | Status: DC | PRN
  Administered 2017-11-11: 30 mL

## 2017-11-11 MED ORDER — LIDOCAINE-EPINEPHRINE 1 %-1:100000 IJ SOLN
INTRAMUSCULAR | Status: AC
  Filled 2017-11-11: qty 1

## 2017-11-11 MED ORDER — PRAVASTATIN SODIUM 80 MG PO TABS: 80.0000 mg | ORAL_TABLET | Freq: Every day | ORAL | 0 refills | Status: DC

## 2017-11-11 MED ORDER — MIDAZOLAM HCL 10 MG/2ML IJ SOLN
INTRAMUSCULAR | Status: DC | PRN
  Administered 2017-11-11: 2 mg via INTRAVENOUS
  Administered 2017-11-11: 1 mg via INTRAVENOUS
  Administered 2017-11-11: 2 mg via INTRAVENOUS

## 2017-11-11 MED ORDER — SODIUM CHLORIDE 0.9 % IV SOLN
INTRAVENOUS | Status: DC
  Administered 2017-11-11: 11:00:00 via INTRAVENOUS

## 2017-11-11 MED ORDER — CLOPIDOGREL BISULFATE 75 MG PO TABS: 75.0000 mg | ORAL_TABLET | Freq: Every day | ORAL | Status: DC

## 2017-11-11 MED ORDER — FENTANYL CITRATE (PF) 100 MCG/2ML IJ SOLN
INTRAMUSCULAR | Status: AC
  Filled 2017-11-11: qty 2

## 2017-11-11 MED ORDER — MIDAZOLAM HCL 5 MG/ML IJ SOLN
INTRAMUSCULAR | Status: AC
  Filled 2017-11-11: qty 2

## 2017-11-11 MED ORDER — ASPIRIN 81 MG PO TBEC: 81.0000 mg | DELAYED_RELEASE_TABLET | Freq: Every day | ORAL | 0 refills | Status: DC

## 2017-11-11 MED ORDER — CLOPIDOGREL BISULFATE 75 MG PO TABS: 75.0000 mg | ORAL_TABLET | Freq: Every day | ORAL | 0 refills | Status: AC

## 2017-11-11 MED ORDER — FENTANYL CITRATE (PF) 100 MCG/2ML IJ SOLN
INTRAMUSCULAR | Status: DC | PRN
  Administered 2017-11-11 (×2): 25 ug via INTRAVENOUS

## 2017-11-11 SURGICAL SUPPLY — 2 items
LOOP REVEAL LINQSYS (Prosthesis & Implant Heart) ×1 IMPLANT
PACK LOOP INSERTION (CUSTOM PROCEDURE TRAY) ×2 IMPLANT

## 2017-11-11 NOTE — Consult Note (Addendum)
ELECTROPHYSIOLOGY CONSULT NOTE  Patient ID: Cassandra Bailey MRN: [id number], DOB/AGE: Nov 25, 1945   Admit date: 11/09/2017 Date of Consult: 11/11/2017  Primary Physician: Merrilee Seashore, MD Primary Cardiologist: none Reason for Consultation: Cryptogenic stroke ; recommendations regarding Implantable Loop Recorder, requested by Dr. Leonie Bailey  History of Present Illness Cassandra Bailey was admitted on 11/09/2017 with CVA.  They first developed symptoms while at home, became acutely dizzy, ataxic, fell.  PMHx is reported to include CAD, the patient denies this, no known cardiac hx out side of HTN, HLD, anxiety, and migraines, no known arrhythmias, she has never seen a cardiologist.   Imaging demonstrated Right PICA territory infarcts likely embolic.  she has undergone workup for stroke including echocardiogram and carotid dopplers, by neuro note, no significant extracranial stenosis.  The patient has been monitored on telemetry which has demonstrated sinus rhythm with no arrhythmias.  Inpatient stroke work-up is to be completed with a TEE.   Echocardiogram this admission demonstrated   Study Conclusions - Left ventricle: The cavity size was normal. There was mild   concentric hypertrophy. Systolic function was vigorous. The   estimated ejection fraction was in the range of 65% to 70%. Wall   motion was normal; there were no regional wall motion   abnormalities. Doppler parameters are consistent with abnormal   left ventricular relaxation (grade 1 diastolic dysfunction).   There was no evidence of elevated ventricular filling pressure by   Doppler parameters. - Aortic valve: There was no regurgitation. - Mitral valve: Valve area by pressure half-time: 1.69 cm^2. - Right ventricle: The cavity size was normal. Wall thickness was   normal. Systolic function was normal. - Right atrium: The atrium was normal in size. - Tricuspid valve: There was trivial regurgitation. - Pulmonary arteries:  Systolic pressure was within the normal   range. - Inferior vena cava: The vessel was normal in size. - Pericardium, extracardiac: There was no pericardial effusion. Impressions: - No cardiac source of emboli was indentified.   Lab work is reviewed.  Prior to admission, the patient denies chest pain, shortness of breath, dizziness, palpitations, or syncope.  They are recovering from their stroke with plans to home at discharge, today per the patient is planned.   Past Medical History:  Diagnosis Date  . Coronary artery disease   . Hypertension      Surgical History: History reviewed. No pertinent surgical history.   Medications Prior to Admission  Medication Sig Dispense Refill Last Dose  . atenolol (TENORMIN) 50 MG tablet Take 50 mg by mouth daily.  3 11/09/2017 at 1300  . ibuprofen (ADVIL,MOTRIN) 200 MG tablet Take 400 mg by mouth 2 (two) times daily.   11/09/2017 at Unknown time  . irbesartan (AVAPRO) 300 MG tablet Take 300 mg by mouth daily.  1 11/09/2017 at Unknown time  . nortriptyline (PAMELOR) 25 MG capsule Take 25 mg by mouth at bedtime.  3 Past Week at Unknown time  . pravastatin (PRAVACHOL) 40 MG tablet Take 40 mg by mouth daily.  3 11/09/2017 at Unknown time  . PREMPRO 0.625-2.5 MG tablet Take 1 tablet by mouth at bedtime.  1 Past Week at Unknown time  . triamterene-hydrochlorothiazide (MAXZIDE) 75-50 MG tablet Take 1 tablet by mouth daily.  0 11/09/2017 at Unknown time  . zolpidem (AMBIEN) 10 MG tablet Take 10 mg by mouth at bedtime.   1 Past Week at Unknown time    Inpatient Medications:  . [MAR Hold] aspirin EC  81  mg Oral Daily  . [MAR Hold] atenolol  50 mg Oral Daily  . [MAR Hold] enoxaparin (LOVENOX) injection  40 mg Subcutaneous Daily  . [MAR Hold] irbesartan  300 mg Oral Daily  . [MAR Hold] nortriptyline  25 mg Oral QHS  . [MAR Hold] potassium chloride  20 mEq Oral BID  . [MAR Hold] pravastatin  80 mg Oral q1800  . [MAR Hold] triamterene-hydrochlorothiazide  1  tablet Oral Daily    Allergies:  Allergies  Allergen Reactions  . Codeine Nausea And Vomiting    Social History   Socioeconomic History  . Marital status: Divorced    Spouse name: Not on file  . Number of children: Not on file  . Years of education: Not on file  . Highest education level: Not on file  Social Needs  . Financial resource strain: Not on file  . Food insecurity - worry: Not on file  . Food insecurity - inability: Not on file  . Transportation needs - medical: Not on file  . Transportation needs - non-medical: Not on file  Occupational History  . Not on file  Tobacco Use  . Smoking status: Never Smoker  . Smokeless tobacco: Never Used  Substance and Sexual Activity  . Alcohol use: Yes  . Drug use: No  . Sexual activity: Not on file  Other Topics Concern  . Not on file  Social History Narrative  . Not on file     Family History  Problem Relation Age of Onset  . Heart disease Father       Review of Systems: All other systems reviewed and are otherwise negative except as noted above.  Physical Exam: Vitals:   11/11/17 0012 11/11/17 0516 11/11/17 0812 11/11/17 1028  BP: 122/67 118/63 110/67 129/74  Pulse: 72 71 77 75  Resp: 16 16 18 16   Temp: 98.2 F (36.8 C) 98.5 F (36.9 C) 98.3 F (36.8 C) 98 F (36.7 C)  TempSrc: Oral Oral Oral Oral  SpO2: 93% 95% 96% 98%  Weight:      Height:        GEN- The patient is well appearing, alert and oriented x 3 today.   Head- normocephalic, atraumatic Eyes-  Sclera clear, conjunctiva pink Ears- hearing intact Oropharynx- clear Neck- supple Lungs- CTA b/l, normal work of breathing Heart- RRR, no murmurs, rubs or gallops  GI- soft, NT, ND Extremities- no clubbing, cyanosis, or edema MS- no significant deformity or atrophy Skin- no rash or lesion Psych- euthymic mood, full affect   Labs:   Lab Results  Component Value Date   WBC 8.1 11/10/2017   HGB 13.2 11/10/2017   HCT 39.9 11/10/2017   MCV  90.7 11/10/2017   PLT 311 11/10/2017    Recent Labs  Lab 11/10/17 0228 11/11/17 0631  NA 137 138  K 3.2* 3.4*  CL 102 100*  CO2 21* 25  BUN 16 20  CREATININE 0.89 0.84  CALCIUM 9.9 8.9  PROT 7.5  --   BILITOT 0.5  --   ALKPHOS 96  --   ALT 14  --   AST 21  --   GLUCOSE 116* 108*   No results found for: CKTOTAL, CKMB, CKMBINDEX, TROPONINI Lab Results  Component Value Date   CHOL 236 (H) 11/10/2017   Lab Results  Component Value Date   HDL 59 11/10/2017   Lab Results  Component Value Date   LDLCALC 129 (H) 11/10/2017   Lab Results  Component Value  Date   TRIG 240 (H) 11/10/2017   Lab Results  Component Value Date   CHOLHDL 4.0 11/10/2017   No results found for: LDLDIRECT  No results found for: DDIMER   Radiology/Studies:  Dg Ribs Unilateral W/chest Left Result Date: 11/09/2017 CLINICAL DATA:  Fall at home in garage onto concrete floor. Left rib pain. EXAM: LEFT RIBS AND CHEST - 3+ VIEW COMPARISON:  None. FINDINGS: No evidence fracture or other bone lesions are seen involving the ribs. There is no evidence of pneumothorax or pleural effusion. Minimal subsegmental atelectasis at the left lung base. Heart size and mediastinal contours are within normal limits. IMPRESSION: 1. No visualized rib fracture or pulmonary complication. 2. Minimal subsegmental left basilar atelectasis. Electronically Signed   By: Jeb Levering M.D.   On: 11/09/2017 21:58   Ct Head Wo Contrast Result Date: 11/09/2017 CLINICAL DATA:  Vertigo with fall EXAM: CT HEAD WITHOUT CONTRAST TECHNIQUE: Contiguous axial images were obtained from the base of the skull through the vertex without intravenous contrast. COMPARISON:  None. FINDINGS: Brain: There is age related volume loss. There is no intracranial mass, hemorrhage, extra-axial fluid collection, or midline shift. There is decreased attenuation in the left medial pons-midbrain junction consistent with an age uncertain and potentially recent  infarct. Elsewhere there is slight small vessel disease in the centra semiovale bilaterally. Vascular: There are no hyperdense vessels. There is calcification in each carotid siphon. Skull: Bony calvarium appears intact. Sinuses/Orbits: Paranasal sinuses are clear. Orbits appear symmetric bilaterally. Other: Mastoid air cells are clear. IMPRESSION: Age uncertain but potentially recent infarct in the medial left pons-midbrain junction. There is slight periventricular small vessel disease. Elsewhere gray-white compartments appear normal. No mass or hemorrhage. There are foci of arterial vascular calcification. Electronically Signed   By: Lowella Grip III M.D.   On: 11/09/2017 23:49   Mr Brain Wo Contrast Result Date: 11/10/2017 CLINICAL DATA:  Initial evaluation for sudden onset dizziness, gait imbalance. EXAM: MRI HEAD WITHOUT CONTRAST MRA HEAD WITHOUT CONTRAST TECHNIQUE: Multiplanar, multiecho pulse sequences of the brain and surrounding structures were obtained without intravenous contrast. Angiographic images of the head were obtained using MRA technique without contrast. COMPARISON:  Mild prior CT from 11/09/2017 FINDINGS: MRI HEAD FINDINGS Brain: Generalized cerebral atrophy. Patchy and confluent T2/FLAIR hyperintensity within the periventricular and deep white matter both cerebral hemispheres most consistent with chronic small vessel ischemic disease, moderate nature. Chronic microvascular changes present within the pons as well. Few small remote right cerebellar infarcts noted. There is patchy small volume acute ischemic infarcts present within the medial aspect of the inferior right cerebellar hemisphere (series 3, image 8). No associated hemorrhage or mass effect. No other evidence for acute or subacute infarction. No acute intracranial hemorrhage. Single punctate chronic microhemorrhage noted within the left occipital lobe, of doubtful significance in isolation. No mass lesion, midline shift or mass  effect. No hydrocephalus. No extra-axial fluid collection. Major dural sinuses are grossly patent. Pituitary gland suprasellar region normal. Midline structures intact. Vascular: Major intracranial vascular flow voids are maintained. Skull and upper cervical spine: Craniocervical junction normal. Upper cervical spine normal. Bone marrow signal intensity within normal limits. No scalp soft tissue abnormality. Sinuses/Orbits: Globes normal soft tissues within normal limits. Patient status post lens extraction bilaterally. Paranasal sinuses are clear. No significant mastoid effusion. Trace opacity noted within left mastoid air cells. Inner ear structures grossly normal. Other: None. MRA HEAD FINDINGS ANTERIOR CIRCULATION: Distal cervical segments of the internal carotid arteries are patent with antegrade flow.  Petrous, cavernous, and supraclinoid segments widely patent. ICA termini widely patent. A1 segments, anterior communicating artery common anterior cerebral arteries widely patent without stenosis. Anterior communicating artery may be fenestrated. M1 segments patent without stenosis. No proximal M2 occlusion. Distal MCA branches well perfused and symmetric. Mild distal small vessel atheromatous irregularity. POSTERIOR CIRCULATION: Dominant left vertebral artery patent to the vertebrobasilar junction without stenosis. Right vertebral artery slightly diminutive with mild atheromatous irregularity without flow-limiting stenosis. Partially visualized posterior inferior cerebral arteries are patent bilaterally. Basilar artery widely patent to its distal aspect. Superior cerebral arteries patent bilaterally. Left PCA supplied via the basilar. Predominant fetal type origin of the right PCA supplied via a widely patent right posterior communicating artery. PCAs widely patent to their distal aspects. No aneurysm. IMPRESSION: MRI HEAD IMPRESSION:: 1. Patchy small volume acute ischemic nonhemorrhagic infarcts involving the  inferior right cerebellar hemisphere, right PICA territory. 2. Age-related cerebral atrophy with moderate chronic small vessel ischemic disease. MRA HEAD IMPRESSION: Negative intracranial MRA. No large vessel occlusion. No high-grade proximal or correctable stenosis. Electronically Signed   By: Jeannine Boga M.D.   On: 11/10/2017 06:54    Dg Hip Unilat With Pelvis 2-3 Views Left Result Date: 11/09/2017 CLINICAL DATA:  Initial evaluation for acute trauma, fall. EXAM: DG HIP (WITH OR WITHOUT PELVIS) 2-3V LEFT COMPARISON:  None. FINDINGS: No acute fracture or dislocation. Femoral heads in normal alignment within the acetabula. Femoral head height preserved. Bony pelvis intact. SI joints approximated. Moderate osteoarthritic changes present about the hips bilaterally, left worse than right. No acute soft tissue abnormality. Degenerative changes noted within lower lumbar spine. IMPRESSION: 1. No acute osseous abnormality about the left hip. 2. Moderate degenerative osteoarthrosis. Electronically Signed   By: Jeannine Boga M.D.   On: 11/09/2017 21:59    12-lead ECG SR All prior EKG's in EPIC reviewed with no documented atrial fibrillation  Telemetry SR, one brief AT  Assessment and Plan:  1. Cryptogenic stroke The patient presents with cryptogenic stroke.  The patient has a TEE planned for this AM.  I spoke at length with the patient about monitoring for afib with either a 30 day event monitor or an implantable loop recorder.  Risks, benefits, and alteratives to implantable loop recorder were discussed with the patient today.   At this time, the patient is very clear in their decision to proceed with implantable loop recorder.   Wound care was reviewed with the patient (keep incision clean and dry for 3 days).  Wound check Mechille Varghese be scheduled for the patient.  Please call with questions.   Baldwin Jamaica, PA-C 11/11/2017  I have seen and examined this patient with Tommye Standard.  Agree  with above, note added to reflect my findings.  On exam, RRR, no murmurs, lungs clear. Presented with CVA. Symptoms of dizziness improved. No cause found thus far. Plan for LINQ implant. Risks and benefits discussed and include bleeding and infection. The patient understands the risks and has agreed to the procedure.    Sebrina Kessner M. Myriam Brandhorst MD 11/11/2017 10:59 AM

## 2017-11-11 NOTE — Interval H&P Note (Signed)
History and Physical Interval Note:  11/11/2017 11:10 AM  Cassandra Bailey  has presented today for surgery, with the diagnosis of stroke  The various methods of treatment have been discussed with the patient and family. After consideration of risks, benefits and other options for treatment, the patient has consented to  Procedure(s): TRANSESOPHAGEAL ECHOCARDIOGRAM (TEE) with loop (N/A) as a surgical intervention .  The patient's history has been reviewed, patient examined, no change in status, stable for surgery.  I have reviewed the patient's chart and labs.  Questions were answered to the patient's satisfaction.     Mertie Moores

## 2017-11-11 NOTE — Progress Notes (Signed)
Discharge instructions reviewed with patient/family. All questions answered at this time. Transport home by family.   Adetokunbo Mccadden, RN 

## 2017-11-11 NOTE — Discharge Instructions (Signed)
Post implant care instructions °Keep incision clean and dry for 3 days. °You can remove outer dressing tomorrow. °Leave steri-strips (little pieces of tape) on until seen in the office for wound check appointment. °Call the office ([phone]) for redness, drainage, swelling, or fever. ° °

## 2017-11-11 NOTE — Discharge Summary (Signed)
Physician Discharge Summary  Cassandra Bailey MGQ:[id number] DOB: 04/29/1946 DOA: 11/09/2017  PCP: Merrilee Seashore, MD  Admit date: 11/09/2017 Discharge date: 11/11/2017   Recommendations for Outpatient Follow-Up:   1. Asa/plavix x 3 weeks then plavix alone 2. Pravastatin increased 3. BMP at next office visit   Discharge Diagnosis:   Active Problems:   Stroke [facility])   Discharge disposition:  Home  Discharge Condition: Improved.  Diet recommendation: Low sodium, heart healthy  Wound care: None.   History of Present Illness:   72 year old woman with medical problems including long-standing hypertension, hyperlipidemia, insomnia, anxiety and history of migraine headaches. She reports developing acute onset of dizziness this morning at approximately 8:15 AM, she describes as an unsteadiness. She reports it lasted about 10 minutes, never experienced anything like this before. She specifically denies the sensation as being vertiginous or lightheadedness. She had to lean against her car and ultimately crawled back inside her house.  She did suffer a fall, had some impact her left hip and ribs. Mild discomfort noted. She went to bed and rested for a few hours, sites around midday she went about her usual activities. Her son encouraged her to seek medical attention therefore she did so.  She denies ever having history of stroke or TIA in the past, specifically denies any history of atrial fibrillation. She reports being separated from her husband and lives alone with her pets. She works 3 days a week in the [employer]. She's never smoker, does not drink alcohol. She enjoys spending time with family, she is accompanied by her son.  She reports some nausea associated with her symptoms. She denies any diplopia, weakness, numbness, fatigue, chest pain, shortness of breath. She reports that her blood pressure management has waxed and waned, reports that most the time her systolic  blood pressures in the 130s.     Hospital Course by Problem:   Cerebellar CVA -? Embolic so TEE done/loop recorder placed -carotid- no significant blockages -LDL > 70-- pravastatin increased -HgbA1C 5.9 -stroke team follow up- referral made -no PT/OT follow up needed  Hypokalemia -replete -follow up outpatient  HTN -resume home meds    Medical Consultants:    Neuro   Discharge Exam:   Vitals:   11/11/17 1155 11/11/17 1218  BP: (!) 128/57 (!) 141/61  Pulse: 80 88  Resp: 12 18  Temp:  98.2 F (36.8 C)  SpO2: 94% 96%   Vitals:   11/11/17 1145 11/11/17 1150 11/11/17 1155 11/11/17 1218  BP: (!) 113/55  (!) 128/57 (!) 141/61  Pulse: 75 73 80 88  Resp:   12 18  Temp:    98.2 F (36.8 C)  TempSrc:    Oral  SpO2: 92% 96% 94% 96%  Weight:      Height:        Gen:  NAD    The results of significant diagnostics from this hospitalization (including imaging, microbiology, ancillary and laboratory) are listed below for reference.     Procedures and Diagnostic Studies:   Dg Ribs Unilateral W/chest Left  Result Date: 11/09/2017 CLINICAL DATA:  Fall at home in garage onto concrete floor. Left rib pain. EXAM: LEFT RIBS AND CHEST - 3+ VIEW COMPARISON:  None. FINDINGS: No evidence fracture or other bone lesions are seen involving the ribs. There is no evidence of pneumothorax or pleural effusion. Minimal subsegmental atelectasis at the left lung base. Heart size and mediastinal contours are within normal limits. IMPRESSION: 1. No visualized rib fracture  or pulmonary complication. 2. Minimal subsegmental left basilar atelectasis. Electronically Signed   By: Jeb Levering M.D.   On: 11/09/2017 21:58   Ct Head Wo Contrast  Result Date: 11/09/2017 CLINICAL DATA:  Vertigo with fall EXAM: CT HEAD WITHOUT CONTRAST TECHNIQUE: Contiguous axial images were obtained from the base of the skull through the vertex without intravenous contrast. COMPARISON:  None. FINDINGS: Brain:  There is age related volume loss. There is no intracranial mass, hemorrhage, extra-axial fluid collection, or midline shift. There is decreased attenuation in the left medial pons-midbrain junction consistent with an age uncertain and potentially recent infarct. Elsewhere there is slight small vessel disease in the centra semiovale bilaterally. Vascular: There are no hyperdense vessels. There is calcification in each carotid siphon. Skull: Bony calvarium appears intact. Sinuses/Orbits: Paranasal sinuses are clear. Orbits appear symmetric bilaterally. Other: Mastoid air cells are clear. IMPRESSION: Age uncertain but potentially recent infarct in the medial left pons-midbrain junction. There is slight periventricular small vessel disease. Elsewhere gray-white compartments appear normal. No mass or hemorrhage. There are foci of arterial vascular calcification. Electronically Signed   By: Lowella Grip III M.D.   On: 11/09/2017 23:49   Mr Brain Wo Contrast  Result Date: 11/10/2017 CLINICAL DATA:  Initial evaluation for sudden onset dizziness, gait imbalance. EXAM: MRI HEAD WITHOUT CONTRAST MRA HEAD WITHOUT CONTRAST TECHNIQUE: Multiplanar, multiecho pulse sequences of the brain and surrounding structures were obtained without intravenous contrast. Angiographic images of the head were obtained using MRA technique without contrast. COMPARISON:  Mild prior CT from 11/09/2017 FINDINGS: MRI HEAD FINDINGS Brain: Generalized cerebral atrophy. Patchy and confluent T2/FLAIR hyperintensity within the periventricular and deep white matter both cerebral hemispheres most consistent with chronic small vessel ischemic disease, moderate nature. Chronic microvascular changes present within the pons as well. Few small remote right cerebellar infarcts noted. There is patchy small volume acute ischemic infarcts present within the medial aspect of the inferior right cerebellar hemisphere (series 3, image 8). No associated hemorrhage  or mass effect. No other evidence for acute or subacute infarction. No acute intracranial hemorrhage. Single punctate chronic microhemorrhage noted within the left occipital lobe, of doubtful significance in isolation. No mass lesion, midline shift or mass effect. No hydrocephalus. No extra-axial fluid collection. Major dural sinuses are grossly patent. Pituitary gland suprasellar region normal. Midline structures intact. Vascular: Major intracranial vascular flow voids are maintained. Skull and upper cervical spine: Craniocervical junction normal. Upper cervical spine normal. Bone marrow signal intensity within normal limits. No scalp soft tissue abnormality. Sinuses/Orbits: Globes normal soft tissues within normal limits. Patient status post lens extraction bilaterally. Paranasal sinuses are clear. No significant mastoid effusion. Trace opacity noted within left mastoid air cells. Inner ear structures grossly normal. Other: None. MRA HEAD FINDINGS ANTERIOR CIRCULATION: Distal cervical segments of the internal carotid arteries are patent with antegrade flow. Petrous, cavernous, and supraclinoid segments widely patent. ICA termini widely patent. A1 segments, anterior communicating artery common anterior cerebral arteries widely patent without stenosis. Anterior communicating artery may be fenestrated. M1 segments patent without stenosis. No proximal M2 occlusion. Distal MCA branches well perfused and symmetric. Mild distal small vessel atheromatous irregularity. POSTERIOR CIRCULATION: Dominant left vertebral artery patent to the vertebrobasilar junction without stenosis. Right vertebral artery slightly diminutive with mild atheromatous irregularity without flow-limiting stenosis. Partially visualized posterior inferior cerebral arteries are patent bilaterally. Basilar artery widely patent to its distal aspect. Superior cerebral arteries patent bilaterally. Left PCA supplied via the basilar. Predominant fetal type  origin of the right  PCA supplied via a widely patent right posterior communicating artery. PCAs widely patent to their distal aspects. No aneurysm. IMPRESSION: MRI HEAD IMPRESSION:: 1. Patchy small volume acute ischemic nonhemorrhagic infarcts involving the inferior right cerebellar hemisphere, right PICA territory. 2. Age-related cerebral atrophy with moderate chronic small vessel ischemic disease. MRA HEAD IMPRESSION: Negative intracranial MRA. No large vessel occlusion. No high-grade proximal or correctable stenosis. Electronically Signed   By: Jeannine Boga M.D.   On: 11/10/2017 06:54   Mr Jodene Nam Head Wo Contrast  Result Date: 11/10/2017 CLINICAL DATA:  Initial evaluation for sudden onset dizziness, gait imbalance. EXAM: MRI HEAD WITHOUT CONTRAST MRA HEAD WITHOUT CONTRAST TECHNIQUE: Multiplanar, multiecho pulse sequences of the brain and surrounding structures were obtained without intravenous contrast. Angiographic images of the head were obtained using MRA technique without contrast. COMPARISON:  Mild prior CT from 11/09/2017 FINDINGS: MRI HEAD FINDINGS Brain: Generalized cerebral atrophy. Patchy and confluent T2/FLAIR hyperintensity within the periventricular and deep white matter both cerebral hemispheres most consistent with chronic small vessel ischemic disease, moderate nature. Chronic microvascular changes present within the pons as well. Few small remote right cerebellar infarcts noted. There is patchy small volume acute ischemic infarcts present within the medial aspect of the inferior right cerebellar hemisphere (series 3, image 8). No associated hemorrhage or mass effect. No other evidence for acute or subacute infarction. No acute intracranial hemorrhage. Single punctate chronic microhemorrhage noted within the left occipital lobe, of doubtful significance in isolation. No mass lesion, midline shift or mass effect. No hydrocephalus. No extra-axial fluid collection. Major dural sinuses are  grossly patent. Pituitary gland suprasellar region normal. Midline structures intact. Vascular: Major intracranial vascular flow voids are maintained. Skull and upper cervical spine: Craniocervical junction normal. Upper cervical spine normal. Bone marrow signal intensity within normal limits. No scalp soft tissue abnormality. Sinuses/Orbits: Globes normal soft tissues within normal limits. Patient status post lens extraction bilaterally. Paranasal sinuses are clear. No significant mastoid effusion. Trace opacity noted within left mastoid air cells. Inner ear structures grossly normal. Other: None. MRA HEAD FINDINGS ANTERIOR CIRCULATION: Distal cervical segments of the internal carotid arteries are patent with antegrade flow. Petrous, cavernous, and supraclinoid segments widely patent. ICA termini widely patent. A1 segments, anterior communicating artery common anterior cerebral arteries widely patent without stenosis. Anterior communicating artery may be fenestrated. M1 segments patent without stenosis. No proximal M2 occlusion. Distal MCA branches well perfused and symmetric. Mild distal small vessel atheromatous irregularity. POSTERIOR CIRCULATION: Dominant left vertebral artery patent to the vertebrobasilar junction without stenosis. Right vertebral artery slightly diminutive with mild atheromatous irregularity without flow-limiting stenosis. Partially visualized posterior inferior cerebral arteries are patent bilaterally. Basilar artery widely patent to its distal aspect. Superior cerebral arteries patent bilaterally. Left PCA supplied via the basilar. Predominant fetal type origin of the right PCA supplied via a widely patent right posterior communicating artery. PCAs widely patent to their distal aspects. No aneurysm. IMPRESSION: MRI HEAD IMPRESSION:: 1. Patchy small volume acute ischemic nonhemorrhagic infarcts involving the inferior right cerebellar hemisphere, right PICA territory. 2. Age-related cerebral  atrophy with moderate chronic small vessel ischemic disease. MRA HEAD IMPRESSION: Negative intracranial MRA. No large vessel occlusion. No high-grade proximal or correctable stenosis. Electronically Signed   By: Jeannine Boga M.D.   On: 11/10/2017 06:54   Dg Hip Unilat With Pelvis 2-3 Views Left  Result Date: 11/09/2017 CLINICAL DATA:  Initial evaluation for acute trauma, fall. EXAM: DG HIP (WITH OR WITHOUT PELVIS) 2-3V LEFT COMPARISON:  None. FINDINGS: No acute  fracture or dislocation. Femoral heads in normal alignment within the acetabula. Femoral head height preserved. Bony pelvis intact. SI joints approximated. Moderate osteoarthritic changes present about the hips bilaterally, left worse than right. No acute soft tissue abnormality. Degenerative changes noted within lower lumbar spine. IMPRESSION: 1. No acute osseous abnormality about the left hip. 2. Moderate degenerative osteoarthrosis. Electronically Signed   By: Jeannine Boga M.D.   On: 11/09/2017 21:59     Labs:   Basic Metabolic Panel: Recent Labs  Lab 11/09/17 2328 11/10/17 0228 11/11/17 0631  NA 137 137 138  K 3.4* 3.2* 3.4*  CL 100* 102 100*  CO2 23 21* 25  GLUCOSE 113* 116* 108*  BUN 16 16 20   CREATININE 0.90 0.89 0.84  CALCIUM 10.2 9.9 8.9   GFR Estimated Creatinine Clearance: 68.5 mL/min (by C-G formula based on SCr of 0.84 mg/dL). Liver Function Tests: Recent Labs  Lab 11/09/17 2328 11/10/17 0228  AST 22 21  ALT 18 14  ALKPHOS 94 96  BILITOT 0.6 0.5  PROT 7.6 7.5  ALBUMIN 3.9 3.7   No results for input(s): LIPASE, AMYLASE in the last 168 hours. No results for input(s): AMMONIA in the last 168 hours. Coagulation profile Recent Labs  Lab 11/09/17 2328  INR 0.93    CBC: Recent Labs  Lab 11/09/17 2328 11/10/17 0228  WBC 9.2 8.1  HGB 14.2 13.2  HCT 41.2 39.9  MCV 90.2 90.7  PLT 317 311   Cardiac Enzymes: No results for input(s): CKTOTAL, CKMB, CKMBINDEX, TROPONINI in the last 168  hours. BNP: Invalid input(s): POCBNP CBG: No results for input(s): GLUCAP in the last 168 hours. D-Dimer No results for input(s): DDIMER in the last 72 hours. Hgb A1c Recent Labs    11/10/17 0228  HGBA1C 5.9*   Lipid Profile Recent Labs    11/10/17 0228  CHOL 236*  HDL 59  LDLCALC 129*  TRIG 240*  CHOLHDL 4.0   Thyroid function studies No results for input(s): TSH, T4TOTAL, T3FREE, THYROIDAB in the last 72 hours.  Invalid input(s): FREET3 Anemia work up No results for input(s): VITAMINB12, FOLATE, FERRITIN, TIBC, IRON, RETICCTPCT in the last 72 hours. Microbiology No results found for this or any previous visit (from the past 240 hour(s)).   Discharge Instructions:   Discharge Instructions    Ambulatory referral to Neurology   Complete by:  As directed    An appointment is requested in approximately: 6 weeks   Diet - low sodium heart healthy   Complete by:  As directed    Discharge instructions   Complete by:  As directed    Dr. Leonie Man would like for you to take the ASA/plavix together for 3 weeks and then the plavix ALONE after that keep incision clean and dry for 3 days.   Wound check scheduled for the patient.   Increase activity slowly   Complete by:  As directed      Allergies as of 11/11/2017      Reactions   Codeine Nausea And Vomiting      Medication List    STOP taking these medications   ibuprofen 200 MG tablet Commonly known as:  ADVIL,MOTRIN     TAKE these medications   aspirin 81 MG EC tablet Take 1 tablet (81 mg total) by mouth daily.   atenolol 50 MG tablet Commonly known as:  TENORMIN Take 50 mg by mouth daily.   clopidogrel 75 MG tablet Commonly known as:  PLAVIX Take 1 tablet (  75 mg total) by mouth daily.   irbesartan 300 MG tablet Commonly known as:  AVAPRO Take 300 mg by mouth daily.   nortriptyline 25 MG capsule Commonly known as:  PAMELOR Take 25 mg by mouth at bedtime.   pravastatin 80 MG tablet Commonly known as:   PRAVACHOL Take 1 tablet (80 mg total) by mouth daily at 6 PM. What changed:    medication strength  how much to take  when to take this   PREMPRO 0.625-2.5 MG tablet Generic drug:  estrogen (conjugated)-medroxyprogesterone Take 1 tablet by mouth at bedtime.   triamterene-hydrochlorothiazide 75-50 MG tablet Commonly known as:  MAXZIDE Take 1 tablet by mouth daily.   zolpidem 10 MG tablet Commonly known as:  AMBIEN Take 10 mg by mouth at bedtime.      Follow-up Information    [facility] Follow up on 11/24/2017.   Specialty:  Cardiology Why:  4:30PM, wound check visit Contact information: [address] [place]       Merrilee Seashore, MD Follow up in 1 week(s).   Specialty:  Internal Medicine Contact information: [address] [place] [address] [phone]            Time coordinating discharge: 35 min  Signed:  Geradine Girt   [facility] 11/11/2017, 1:10 PM

## 2017-11-11 NOTE — CV Procedure (Signed)
    Transesophageal Echocardiogram Note  Cassandra Bailey [id number] [birth year]  Procedure: Transesophageal Echocardiogram Indications: CVA   Procedure Details Consent: Obtained Time Out: Verified patient identification, verified procedure, site/side was marked, verified correct patient position, special equipment/implants available, Radiology Safety Procedures followed,  medications/allergies/relevent history reviewed, required imaging and test results available.  Performed  Medications:  During this procedure the patient is administered a total of Versed 5 mg and Fentanyl 50  mcg  to achieve and maintain moderate conscious sedation.  The patient's heart rate, blood pressure, and oxygen saturation are monitored continuously during the procedure. The period of conscious sedation is 30  minutes, of which I was present face-to-face 100% of this time.  Left Ventrical:  Normal LV function   Mitral Valve: normal  Aortic Valve: normal   Tricuspid Valve: normal   Pulmonic Valve: normal   Left Atrium/ Left atrial appendage: no LAA thrombus   Atrial septum:  No ASD or PFO by color or bubble study   Aorta: there is a moderate calcified atherosclerotic plaque in the aortic arch .    Complications: No apparent complications Patient did tolerate procedure well.   Thayer Headings, [name], MD, [facility] 11/11/2017, 11:27 AM

## 2017-11-11 NOTE — Progress Notes (Signed)
Patient returned from TEE/Loop placement at this time. VSS. Pt denied any distress or pain. Loop site CDI. Will continue to monitor.  Ave Filter, RN

## 2017-11-11 NOTE — Progress Notes (Signed)
STROKE TEAM PROGRESS NOTE   HISTORY OF PRESENT ILLNESS (per record) Cassandra Bailey is an 72 y.o. female past medical history of hypertension, hyperlipidemia, anxiety and migraine presents to the hospital after having sudden onset episode of dizziness and gait imbalance at 8:15 in the morning while she was walking her dog. She states that symptoms occur suddenly, denies sensation of spinning, but just felt off balance. She leaned against her car on the left side and then had a fall resulting in pain in her hip. She walked back to her house and and laid in bed for 3 hours. Her son encouraged her to come to the hospital. She denies any ringing sensation ears, slurred speech, double vision.She feels completely back to herself. CT head showed decreased attenuation in the left medial pons-midbrain.  Date last known well: 3.17.19 Time last known well: 8.15 am tPA Given: symptoms resolved NIHSS: 0 Baseline MRS 0   SUBJECTIVE (INTERVAL HISTORY) Her daughter and son are  at the bedside.   Patient seen in TEE TEE was unrerkable.                                 OBJECTIVE Temp:  [97.5 F (36.4 C)-98.5 F (36.9 C)] 98.2 F (36.8 C) (03/19 1218) Pulse Rate:  [66-88] 88 (03/19 1218) Cardiac Rhythm: Sinus bradycardia (03/19 0800) Resp:  [10-24] 18 (03/19 1218) BP: (92-141)/(38-74) 141/61 (03/19 1218) SpO2:  [92 %-100 %] 96 % (03/19 1218)  CBC:  Recent Labs  Lab 11/09/17 2328 11/10/17 0228  WBC 9.2 8.1  HGB 14.2 13.2  HCT 41.2 39.9  MCV 90.2 90.7  PLT 317 169    Basic Metabolic Panel:  Recent Labs  Lab 11/10/17 0228 11/11/17 0631  NA 137 138  K 3.2* 3.4*  CL 102 100*  CO2 21* 25  GLUCOSE 116* 108*  BUN 16 20  CREATININE 0.89 0.84  CALCIUM 9.9 8.9    Lipid Panel:     Component Value Date/Time   CHOL 236 (H) 11/10/2017 0228   TRIG 240 (H) 11/10/2017 0228   HDL 59 11/10/2017 0228   CHOLHDL 4.0 11/10/2017 0228   VLDL 48 (H) 11/10/2017 0228   LDLCALC 129 (H)  11/10/2017 0228   HgbA1c:  Lab Results  Component Value Date   HGBA1C 5.9 (H) 11/10/2017   Urine Drug Screen:     Component Value Date/Time   LABOPIA NONE DETECTED 11/09/2017 2351   COCAINSCRNUR NONE DETECTED 11/09/2017 2351   LABBENZ NONE DETECTED 11/09/2017 2351   AMPHETMU NONE DETECTED 11/09/2017 2351   THCU NONE DETECTED 11/09/2017 2351   LABBARB NONE DETECTED 11/09/2017 2351    Alcohol Level No results found for: ETH  IMAGING  Dg Ribs Unilateral W/chest Left 11/09/2017 IMPRESSION:  1. No visualized rib fracture or pulmonary complication.  2. Minimal subsegmental left basilar atelectasis.   Ct Head Wo Contrast 11/09/2017 IMPRESSION:  Age uncertain but potentially recent infarct in the medial left pons-midbrain junction. There is slight periventricular small vessel disease. Elsewhere gray-white compartments appear normal. No mass or hemorrhage. There are foci of arterial vascular calcification.   Mr Jodene Nam Head Wo Contrast 11/10/2017 IMPRESSION:   MRI HEAD IMPRESSION: 1. Patchy small volume acute ischemic nonhemorrhagic infarcts involving the inferior right cerebellar hemisphere, right PICA territory.  2. Age-related cerebral atrophy with moderate chronic small vessel ischemic disease.   MRA HEAD IMPRESSION:  Negative intracranial MRA. No large vessel occlusion. No  high-grade proximal or correctable stenosis.    Dg Hip Unilat With Pelvis 2-3 Views Left 11/09/2017 IMPRESSION:  1. No acute osseous abnormality about the left hip.  2. Moderate degenerative osteoarthrosis.      Transthoracic Echocardiogram - Left ventricle: The cavity size was normal. There was mild   concentric hypertrophy. Systolic function was vigorous. The   estimated ejection fraction was in the range of 65% to 70%. Wall   motion was normal; there were no regional wall motion   abnormalities.      Bilateral Carotid Dopplers - no significant extracranial stenosis.       PHYSICAL  EXAM Vitals:   11/11/17 1145 11/11/17 1150 11/11/17 1155 11/11/17 1218  BP: (!) 113/55  (!) 128/57 (!) 141/61  Pulse: 75 73 80 88  Resp:   12 18  Temp:    98.2 F (36.8 C)  TempSrc:    Oral  SpO2: 92% 96% 94% 96%  Weight:      Height:       Pleasant elderly Caucasian lady currently not in distress. . Afebrile. Head is nontraumatic. Neck is supple without bruit.    Cardiac exam no murmur or gallop. Lungs are clear to auscultation. Distal pulses are well felt.  Neurological Exam ;  Awake  Alert oriented x 3. Normal speech and language.eye movements full without nystagmus.fundi were not visualized. Vision acuity and fields appear normal. Hearing is normal. Palatal movements are normal. Face symmetric. Tongue midline. Normal strength, tone, reflexes and coordination. Normal sensation. Gait deferred.   HOME MEDICATIONS:  Medications Prior to Admission  Medication Sig Dispense Refill  . atenolol (TENORMIN) 50 MG tablet Take 50 mg by mouth daily.  3  . ibuprofen (ADVIL,MOTRIN) 200 MG tablet Take 400 mg by mouth 2 (two) times daily.    . irbesartan (AVAPRO) 300 MG tablet Take 300 mg by mouth daily.  1  . nortriptyline (PAMELOR) 25 MG capsule Take 25 mg by mouth at bedtime.  3  . pravastatin (PRAVACHOL) 40 MG tablet Take 40 mg by mouth daily.  3  . PREMPRO 0.625-2.5 MG tablet Take 1 tablet by mouth at bedtime.  1  . triamterene-hydrochlorothiazide (MAXZIDE) 75-50 MG tablet Take 1 tablet by mouth daily.  0  . zolpidem (AMBIEN) 10 MG tablet Take 10 mg by mouth at bedtime.   1      HOSPITAL MEDICATIONS:  . aspirin EC  81 mg Oral Daily  . atenolol  50 mg Oral Daily  . enoxaparin (LOVENOX) injection  40 mg Subcutaneous Daily  . irbesartan  300 mg Oral Daily  . nortriptyline  25 mg Oral QHS  . potassium chloride  20 mEq Oral BID  . pravastatin  80 mg Oral q1800  . triamterene-hydrochlorothiazide  1 tablet Oral Daily     ASSESSMENT/PLAN Cassandra Bailey is a 72 y.o. female with  history of hypertension, CAD, hyperlipidemia, anxiety and migraine  presenting with dizziness and gait disturbance. She did not receive IV t-PA due to resolution of deficits.   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  Resultant  Mild dizzinessCT head - potentially recent infarct in the medial left pons-midbrain junction.  MRI head - Patchy small volume acute ischemic nonhemorrhagic infarcts involving the inferior right cerebellar hemisphere, right PICA territory.   MRA head - Negative intracranial MRA.  Carotid Doppler - no significant extracranial stenosis  2D Echo - normal EF. No clot  LDL - 129  HgbA1c - 5.9  VTE prophylaxis - SCDs Fall precautions Diet Heart Room service appropriate? Yes; Fluid consistency: Thin  No antithrombotic prior to admission, now on aspirin 81 mg daily  Patient counseled to be compliant with her antithrombotic medications  Ongoing aggressive stroke risk factor management  Therapy recommendations:  No PT follow-up recommended.  Disposition:  Pending  Hypertension  Stable  Permissive hypertension (OK if < 220/120) but gradually normalize in 5-7 days  Long-term BP goal normotensive  Hyperlipidemia  Lipid lowering medication PTA:  none  LDL 129, goal < 70  Current lipid lowering medication: Pravachol 40 mg daily  Continue statin at discharge    Other Stroke Risk Factors  Advanced age  ETOH use, advised to drink no more than 1 drink per day.  Overweight, Body mass index is 27.99 kg/m., recommend weight loss, diet and exercise as appropriate   Coronary artery disease   Other Active Problems  Hypokalemia - 3.2 -supplement and recheck   Plan / Recommendations   Plavix for 3 weeks followed by Plavix alone.discharge home after loop recorder placement. Follow-up as an outpatient in the stroke clinic in 6 weeks.Stroke team will sign off. Kindly call for questions.. Discussed with Dr. Leroy Kennedy  day # [facility], [place] Pager: [phone] 11/11/2017 12:55 PM 11/11/2017, 12:51 PM   To contact Stroke Continuity provider, please refer to [email or web address] After hours, contact General Neurology

## 2017-11-11 NOTE — Care Management Note (Signed)
Case Management Note  Patient Details  Name: Cassandra Bailey MRN: [id number] Date of Birth: 03-26-1946  Subjective/Objective:                    Action/Plan: Pt discharging home with self care. No f/u per PT/OT and no DME needs.  Pt has PCP, insurance and transportation home. CM signing off.    Expected Discharge Date:  11/11/17               Expected Discharge Plan:  Home/Self Care  In-House Referral:     Discharge planning Services     Post Acute Care Choice:    Choice offered to:     DME Arranged:    DME Agency:     HH Arranged:    HH Agency:     Status of Service:  Completed, signed off  If discussed at [employer] of Stay Meetings, dates discussed:    Additional Comments:  Pollie Friar, RN 11/11/2017, 1:40 PM

## 2017-11-11 NOTE — Progress Notes (Signed)
Patient left unit for TEE at this time.    Ave Filter, RN

## 2017-11-11 NOTE — H&P (View-Only) (Signed)
ELECTROPHYSIOLOGY CONSULT NOTE  Patient ID: [place] SARINANA MRN: [id number], DOB/AGE: [birth year]   Admit date: 11/09/2017 Date of Consult: 11/11/2017  Primary Physician: Merrilee Seashore, MD Primary Cardiologist: none Reason for Consultation: Cryptogenic stroke ; recommendations regarding Implantable Loop Recorder, requested by Dr. Leonie Man  History of Present Illness Cassandra Bailey was admitted on 11/09/2017 with CVA.  They first developed symptoms while at home, became acutely dizzy, ataxic, fell.  PMHx is reported to include CAD, the patient denies this, no known cardiac hx out side of HTN, HLD, anxiety, and migraines, no known arrhythmias, she has never seen a cardiologist.   Imaging demonstrated Right PICA territory infarcts likely embolic.  she has undergone workup for stroke including echocardiogram and carotid dopplers, by neuro note, no significant extracranial stenosis.  The patient has been monitored on telemetry which has demonstrated sinus rhythm with no arrhythmias.  Inpatient stroke work-up is to be completed with a TEE.   Echocardiogram this admission demonstrated   Study Conclusions - Left ventricle: The cavity size was normal. There was mild   concentric hypertrophy. Systolic function was vigorous. The   estimated ejection fraction was in the range of 65% to 70%. Wall   motion was normal; there were no regional wall motion   abnormalities. Doppler parameters are consistent with abnormal   left ventricular relaxation (grade 1 diastolic dysfunction).   There was no evidence of elevated ventricular filling pressure by   Doppler parameters. - Aortic valve: There was no regurgitation. - Mitral valve: Valve area by pressure half-time: 1.69 cm^2. - Right ventricle: The cavity size was normal. Wall thickness was   normal. Systolic function was normal. - Right atrium: The atrium was normal in size. - Tricuspid valve: There was trivial regurgitation. - Pulmonary arteries:  Systolic pressure was within the normal   range. - Inferior vena cava: The vessel was normal in size. - Pericardium, extracardiac: There was no pericardial effusion. Impressions: - No cardiac source of emboli was indentified.   Lab work is reviewed.  Prior to admission, the patient denies chest pain, shortness of breath, dizziness, palpitations, or syncope.  They are recovering from their stroke with plans to home at discharge, today per the patient is planned.   Past Medical History:  Diagnosis Date  . Coronary artery disease   . Hypertension      Surgical History: History reviewed. No pertinent surgical history.   Medications Prior to Admission  Medication Sig Dispense Refill Last Dose  . atenolol (TENORMIN) 50 MG tablet Take 50 mg by mouth daily.  3 11/09/2017 at 1300  . ibuprofen (ADVIL,MOTRIN) 200 MG tablet Take 400 mg by mouth 2 (two) times daily.   11/09/2017 at Unknown time  . irbesartan (AVAPRO) 300 MG tablet Take 300 mg by mouth daily.  1 11/09/2017 at Unknown time  . nortriptyline (PAMELOR) 25 MG capsule Take 25 mg by mouth at bedtime.  3 Past Week at Unknown time  . pravastatin (PRAVACHOL) 40 MG tablet Take 40 mg by mouth daily.  3 11/09/2017 at Unknown time  . PREMPRO 0.625-2.5 MG tablet Take 1 tablet by mouth at bedtime.  1 Past Week at Unknown time  . triamterene-hydrochlorothiazide (MAXZIDE) 75-50 MG tablet Take 1 tablet by mouth daily.  0 11/09/2017 at Unknown time  . zolpidem (AMBIEN) 10 MG tablet Take 10 mg by mouth at bedtime.   1 Past Week at Unknown time    Inpatient Medications:  . [MAR Hold] aspirin EC  81  mg Oral Daily  . [MAR Hold] atenolol  50 mg Oral Daily  . [MAR Hold] enoxaparin (LOVENOX) injection  40 mg Subcutaneous Daily  . [MAR Hold] irbesartan  300 mg Oral Daily  . [MAR Hold] nortriptyline  25 mg Oral QHS  . [MAR Hold] potassium chloride  20 mEq Oral BID  . [MAR Hold] pravastatin  80 mg Oral q1800  . [MAR Hold] triamterene-hydrochlorothiazide  1  tablet Oral Daily    Allergies:  Allergies  Allergen Reactions  . Codeine Nausea And Vomiting    Social History   Socioeconomic History  . Marital status: Divorced    Spouse name: Not on file  . Number of children: Not on file  . Years of education: Not on file  . Highest education level: Not on file  Social Needs  . Financial resource strain: Not on file  . Food insecurity - worry: Not on file  . Food insecurity - inability: Not on file  . Transportation needs - medical: Not on file  . Transportation needs - non-medical: Not on file  Occupational History  . Not on file  Tobacco Use  . Smoking status: Never Smoker  . Smokeless tobacco: Never Used  Substance and Sexual Activity  . Alcohol use: Yes  . Drug use: No  . Sexual activity: Not on file  Other Topics Concern  . Not on file  Social History Narrative  . Not on file     Family History  Problem Relation Age of Onset  . Heart disease Father       Review of Systems: All other systems reviewed and are otherwise negative except as noted above.  Physical Exam: Vitals:   11/11/17 0012 11/11/17 0516 11/11/17 0812 11/11/17 1028  BP: 122/67 118/63 110/67 129/74  Pulse: 72 71 77 75  Resp: 16 16 18 16   Temp: 98.2 F (36.8 C) 98.5 F (36.9 C) 98.3 F (36.8 C) 98 F (36.7 C)  TempSrc: Oral Oral Oral Oral  SpO2: 93% 95% 96% 98%  Weight:      Height:        GEN- The patient is well appearing, alert and oriented x 3 today.   Head- normocephalic, atraumatic Eyes-  Sclera clear, conjunctiva pink Ears- hearing intact Oropharynx- clear Neck- supple Lungs- CTA b/l, normal work of breathing Heart- RRR, no murmurs, rubs or gallops  GI- soft, NT, ND Extremities- no clubbing, cyanosis, or edema MS- no significant deformity or atrophy Skin- no rash or lesion Psych- euthymic mood, full affect   Labs:   Lab Results  Component Value Date   WBC 8.1 11/10/2017   HGB 13.2 11/10/2017   HCT 39.9 11/10/2017   MCV  90.7 11/10/2017   PLT 311 11/10/2017    Recent Labs  Lab 11/10/17 0228 11/11/17 0631  NA 137 138  K 3.2* 3.4*  CL 102 100*  CO2 21* 25  BUN 16 20  CREATININE 0.89 0.84  CALCIUM 9.9 8.9  PROT 7.5  --   BILITOT 0.5  --   ALKPHOS 96  --   ALT 14  --   AST 21  --   GLUCOSE 116* 108*   No results found for: CKTOTAL, CKMB, CKMBINDEX, TROPONINI Lab Results  Component Value Date   CHOL 236 (H) 11/10/2017   Lab Results  Component Value Date   HDL 59 11/10/2017   Lab Results  Component Value Date   LDLCALC 129 (H) 11/10/2017   Lab Results  Component Value  Date   TRIG 240 (H) 11/10/2017   Lab Results  Component Value Date   CHOLHDL 4.0 11/10/2017   No results found for: LDLDIRECT  No results found for: DDIMER   Radiology/Studies:  Dg Ribs Unilateral W/chest Left Result Date: 11/09/2017 CLINICAL DATA:  Fall at home in garage onto concrete floor. Left rib pain. EXAM: LEFT RIBS AND CHEST - 3+ VIEW COMPARISON:  None. FINDINGS: No evidence fracture or other bone lesions are seen involving the ribs. There is no evidence of pneumothorax or pleural effusion. Minimal subsegmental atelectasis at the left lung base. Heart size and mediastinal contours are within normal limits. IMPRESSION: 1. No visualized rib fracture or pulmonary complication. 2. Minimal subsegmental left basilar atelectasis. Electronically Signed   By: Jeb Levering M.D.   On: 11/09/2017 21:58   Ct Head Wo Contrast Result Date: 11/09/2017 CLINICAL DATA:  Vertigo with fall EXAM: CT HEAD WITHOUT CONTRAST TECHNIQUE: Contiguous axial images were obtained from the base of the skull through the vertex without intravenous contrast. COMPARISON:  None. FINDINGS: Brain: There is age related volume loss. There is no intracranial mass, hemorrhage, extra-axial fluid collection, or midline shift. There is decreased attenuation in the left medial pons-midbrain junction consistent with an age uncertain and potentially recent  infarct. Elsewhere there is slight small vessel disease in the centra semiovale bilaterally. Vascular: There are no hyperdense vessels. There is calcification in each carotid siphon. Skull: Bony calvarium appears intact. Sinuses/Orbits: Paranasal sinuses are clear. Orbits appear symmetric bilaterally. Other: Mastoid air cells are clear. IMPRESSION: Age uncertain but potentially recent infarct in the medial left pons-midbrain junction. There is slight periventricular small vessel disease. Elsewhere gray-white compartments appear normal. No mass or hemorrhage. There are foci of arterial vascular calcification. Electronically Signed   By: Lowella Grip III M.D.   On: 11/09/2017 23:49   Mr Brain Wo Contrast Result Date: 11/10/2017 CLINICAL DATA:  Initial evaluation for sudden onset dizziness, gait imbalance. EXAM: MRI HEAD WITHOUT CONTRAST MRA HEAD WITHOUT CONTRAST TECHNIQUE: Multiplanar, multiecho pulse sequences of the brain and surrounding structures were obtained without intravenous contrast. Angiographic images of the head were obtained using MRA technique without contrast. COMPARISON:  Mild prior CT from 11/09/2017 FINDINGS: MRI HEAD FINDINGS Brain: Generalized cerebral atrophy. Patchy and confluent T2/FLAIR hyperintensity within the periventricular and deep white matter both cerebral hemispheres most consistent with chronic small vessel ischemic disease, moderate nature. Chronic microvascular changes present within the pons as well. Few small remote right cerebellar infarcts noted. There is patchy small volume acute ischemic infarcts present within the medial aspect of the inferior right cerebellar hemisphere (series 3, image 8). No associated hemorrhage or mass effect. No other evidence for acute or subacute infarction. No acute intracranial hemorrhage. Single punctate chronic microhemorrhage noted within the left occipital lobe, of doubtful significance in isolation. No mass lesion, midline shift or mass  effect. No hydrocephalus. No extra-axial fluid collection. Major dural sinuses are grossly patent. Pituitary gland suprasellar region normal. Midline structures intact. Vascular: Major intracranial vascular flow voids are maintained. Skull and upper cervical spine: Craniocervical junction normal. Upper cervical spine normal. Bone marrow signal intensity within normal limits. No scalp soft tissue abnormality. Sinuses/Orbits: Globes normal soft tissues within normal limits. Patient status post lens extraction bilaterally. Paranasal sinuses are clear. No significant mastoid effusion. Trace opacity noted within left mastoid air cells. Inner ear structures grossly normal. Other: None. MRA HEAD FINDINGS ANTERIOR CIRCULATION: Distal cervical segments of the internal carotid arteries are patent with antegrade flow.  Petrous, cavernous, and supraclinoid segments widely patent. ICA termini widely patent. A1 segments, anterior communicating artery common anterior cerebral arteries widely patent without stenosis. Anterior communicating artery may be fenestrated. M1 segments patent without stenosis. No proximal M2 occlusion. Distal MCA branches well perfused and symmetric. Mild distal small vessel atheromatous irregularity. POSTERIOR CIRCULATION: Dominant left vertebral artery patent to the vertebrobasilar junction without stenosis. Right vertebral artery slightly diminutive with mild atheromatous irregularity without flow-limiting stenosis. Partially visualized posterior inferior cerebral arteries are patent bilaterally. Basilar artery widely patent to its distal aspect. Superior cerebral arteries patent bilaterally. Left PCA supplied via the basilar. Predominant fetal type origin of the right PCA supplied via a widely patent right posterior communicating artery. PCAs widely patent to their distal aspects. No aneurysm. IMPRESSION: MRI HEAD IMPRESSION:: 1. Patchy small volume acute ischemic nonhemorrhagic infarcts involving the  inferior right cerebellar hemisphere, right PICA territory. 2. Age-related cerebral atrophy with moderate chronic small vessel ischemic disease. MRA HEAD IMPRESSION: Negative intracranial MRA. No large vessel occlusion. No high-grade proximal or correctable stenosis. Electronically Signed   By: Jeannine Boga M.D.   On: 11/10/2017 06:54    Dg Hip Unilat With Pelvis 2-3 Views Left Result Date: 11/09/2017 CLINICAL DATA:  Initial evaluation for acute trauma, fall. EXAM: DG HIP (WITH OR WITHOUT PELVIS) 2-3V LEFT COMPARISON:  None. FINDINGS: No acute fracture or dislocation. Femoral heads in normal alignment within the acetabula. Femoral head height preserved. Bony pelvis intact. SI joints approximated. Moderate osteoarthritic changes present about the hips bilaterally, left worse than right. No acute soft tissue abnormality. Degenerative changes noted within lower lumbar spine. IMPRESSION: 1. No acute osseous abnormality about the left hip. 2. Moderate degenerative osteoarthrosis. Electronically Signed   By: Jeannine Boga M.D.   On: 11/09/2017 21:59    12-lead ECG SR All prior EKG's in EPIC reviewed with no documented atrial fibrillation  Telemetry SR, one brief AT  Assessment and Plan:  1. Cryptogenic stroke The patient presents with cryptogenic stroke.  The patient has a TEE planned for this AM.  I spoke at length with the patient about monitoring for afib with either a 30 day event monitor or an implantable loop recorder.  Risks, benefits, and alteratives to implantable loop recorder were discussed with the patient today.   At this time, the patient is very clear in their decision to proceed with implantable loop recorder.   Wound care was reviewed with the patient (keep incision clean and dry for 3 days).  Wound check will be scheduled for the patient.  Please call with questions.   Baldwin Jamaica, PA-C 11/11/2017  I have seen and examined this patient with Tommye Standard.  Agree  with above, note added to reflect my findings.  On exam, RRR, no murmurs, lungs clear. Presented with CVA. Symptoms of dizziness improved. No cause found thus far. Plan for LINQ implant. Risks and benefits discussed and include bleeding and infection. The patient understands the risks and has agreed to the procedure.    Will M. Camnitz MD 11/11/2017 10:59 AM

## 2017-11-12 ENCOUNTER — Encounter (HOSPITAL_COMMUNITY): Payer: Self-pay | Admitting: Cardiovascular Disease

## 2017-11-12 NOTE — Consult Note (Signed)
            [facility] CM Primary Care Navigator  11/12/2017  Cassandra Bailey November 05, 1945 [id number]   Wentto seepatient at the bedsideto identify possible discharge needs but she was  already dischargedhome yesterday perstaff report.   Per chart review, patient was seen after developing acute onset of dizziness as what she describes as an unsteadiness. She was followed by stroke team and TEE done/ loop recorder placed.  Primary care provider's officeis listed as providing transition of care (TOC).  Patient has a discharge instruction to follow-up with primary care provider in 1 week and follow-up with cardiology on 11/24/17.  Notedwithorder for EMMI Stroke calls already in place to follow-up recovery at home .   For additional questions please contact:  Edwena Felty A. Baptiste Littler, BSN, RN-BC [facility] PRIMARY CARE Navigator Cell: [phone]

## 2017-11-20 DIAGNOSIS — I10 Essential (primary) hypertension: Secondary | ICD-10-CM | POA: Diagnosis not present

## 2017-11-20 DIAGNOSIS — I639 Cerebral infarction, unspecified: Secondary | ICD-10-CM | POA: Diagnosis not present

## 2017-11-24 ENCOUNTER — Ambulatory Visit (INDEPENDENT_AMBULATORY_CARE_PROVIDER_SITE_OTHER): Payer: Self-pay | Admitting: *Deleted

## 2017-11-24 DIAGNOSIS — I6300 Cerebral infarction due to thrombosis of unspecified precerebral artery: Secondary | ICD-10-CM

## 2017-11-24 NOTE — Progress Notes (Signed)
Wound check appointment. Steri-strips removed by patient. Wound without redness or edema. Incision edges approximated, wound well healed. Loop check in clinic. Battery status: Good. R-waves 0.36mV. 0 symptom episodes, 0 tachy episodes, 0 pause episodes, 0 brady episodes. 0 AF episodes. Monthly summary reports and ROV with WC PRN

## 2017-12-04 ENCOUNTER — Emergency Department (HOSPITAL_COMMUNITY): Payer: Medicare Other

## 2017-12-04 ENCOUNTER — Emergency Department (HOSPITAL_COMMUNITY)
Admission: EM | Admit: 2017-12-04 | Discharge: 2017-12-05 | Disposition: A | Discharge status: Home / Self Care | Payer: Medicare Other | Attending: Emergency Medicine | Admitting: Emergency Medicine

## 2017-12-04 ENCOUNTER — Encounter (HOSPITAL_COMMUNITY): Payer: Self-pay | Admitting: *Deleted

## 2017-12-04 DIAGNOSIS — R002 Palpitations: Secondary | ICD-10-CM

## 2017-12-04 DIAGNOSIS — I10 Essential (primary) hypertension: Secondary | ICD-10-CM | POA: Diagnosis not present

## 2017-12-04 DIAGNOSIS — Z79899 Other long term (current) drug therapy: Secondary | ICD-10-CM | POA: Diagnosis not present

## 2017-12-04 DIAGNOSIS — R270 Ataxia, unspecified: Secondary | ICD-10-CM | POA: Insufficient documentation

## 2017-12-04 DIAGNOSIS — I2510 Atherosclerotic heart disease of native coronary artery without angina pectoris: Secondary | ICD-10-CM | POA: Diagnosis not present

## 2017-12-04 DIAGNOSIS — Z7982 Long term (current) use of aspirin: Secondary | ICD-10-CM | POA: Diagnosis not present

## 2017-12-04 LAB — CBC
HCT: 40.2 % (ref 36.0–46.0)
Hemoglobin: 13.4 g/dL (ref 12.0–15.0)
MCH: 30.7 pg (ref 26.0–34.0)
MCHC: 33.3 g/dL (ref 30.0–36.0)
MCV: 92.2 fL (ref 78.0–100.0)
PLATELETS: 325 10*3/uL (ref 150–400)
RBC: 4.36 MIL/uL (ref 3.87–5.11)
RDW: 14 % (ref 11.5–15.5)
WBC: 6.9 10*3/uL (ref 4.0–10.5)

## 2017-12-04 LAB — BASIC METABOLIC PANEL
Anion gap: 13 (ref 5–15)
BUN: 12 mg/dL (ref 6–20)
CHLORIDE: 100 mmol/L — AB (ref 101–111)
CO2: 23 mmol/L (ref 22–32)
CREATININE: 0.75 mg/dL (ref 0.44–1.00)
Calcium: 9.5 mg/dL (ref 8.9–10.3)
GFR calc non Af Amer: >60 mL/min (ref >60)
Glucose, Bld: 106 mg/dL — ABNORMAL HIGH (ref 65–99)
Potassium: 3 mmol/L — ABNORMAL LOW (ref 3.5–5.1)
Sodium: 136 mmol/L (ref 135–145)

## 2017-12-04 LAB — I-STAT TROPONIN, ED: Troponin i, poc: 0 ng/mL (ref 0.00–0.08)

## 2017-12-04 MED ORDER — POTASSIUM CHLORIDE CRYS ER 20 MEQ PO TBCR
40.0000 meq | EXTENDED_RELEASE_TABLET | Freq: Once | ORAL | Status: AC
  Administered 2017-12-05: 40 meq via ORAL
  Filled 2017-12-04: qty 2

## 2017-12-04 NOTE — ED Triage Notes (Signed)
Pt is here due to feeling like she is having palpitations today.  She checked her BP and pulse and both were elevated.  HR was 110.  No CP or sob with this.  She spoke with PCP and he advised that she should come be seen.  Pt appears in no acute distress in triage.  EKG shows sinus tach with pac.

## 2017-12-05 ENCOUNTER — Emergency Department (HOSPITAL_COMMUNITY): Payer: Medicare Other

## 2017-12-05 DIAGNOSIS — R270 Ataxia, unspecified: Secondary | ICD-10-CM | POA: Diagnosis not present

## 2017-12-05 DIAGNOSIS — R002 Palpitations: Secondary | ICD-10-CM | POA: Diagnosis not present

## 2017-12-05 LAB — URINALYSIS, ROUTINE W REFLEX MICROSCOPIC
BILIRUBIN URINE: NEGATIVE
Glucose, UA: NEGATIVE mg/dL
Ketones, ur: NEGATIVE mg/dL
Leukocytes, UA: NEGATIVE
NITRITE: NEGATIVE
PH: 6 (ref 5.0–8.0)
Protein, ur: NEGATIVE mg/dL
SPECIFIC GRAVITY, URINE: 1.008 (ref 1.005–1.030)

## 2017-12-05 LAB — D-DIMER, QUANTITATIVE (NOT AT ARMC): D DIMER QUANT: 0.47 ug{FEU}/mL (ref 0.00–0.50)

## 2017-12-05 MED ORDER — SODIUM CHLORIDE 0.9 % IV BOLUS
500.0000 mL | Freq: Once | INTRAVENOUS | Status: AC
  Administered 2017-12-05: 500 mL via INTRAVENOUS

## 2017-12-05 NOTE — ED Notes (Signed)
Interrogated med tronic loop recorder. Waiting for results.

## 2017-12-05 NOTE — ED Provider Notes (Signed)
[facility] EMERGENCY DEPARTMENT Provider Note   CSN: [id number] Arrival date & time: 12/04/17  1853     History   Chief Complaint Chief Complaint  Patient presents with  . Palpitations    HPI Cassandra Bailey is a 72 y.o. female.  Patient is a 72 year old female with a history of coronary artery disease, hypertension and recent stroke presents with palpitations.  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  She states over the last week and 1/2-2 weeks she has had some increase fatigue.  She feels a little bit weaker in her arms and her legs and she had before.  It is bilateral.  She denies any shortness of breath.  She has had some fleeting episodes of chest pain but describes it is as a brief achy pain that lasts only 1-2 seconds.  It has been intermittent over the last week or so.  She denies any currently.  She denies any cough or chest congestion.  No fevers.  She has a little bit of dysuria where she feels like she not completely emptying her bladder.  No nausea vomiting or diarrhea.  Today she felt like her heart was faster than normal.  She checked it it was about 115.  Her blood pressure was also elevated at that time.  She has noted a little bit of edema to her ankles which she has not had in the past.     Past Medical History:  Diagnosis Date  . Coronary artery disease   . Hypertension     Patient Active Problem List   Diagnosis Date Noted  . HLD (hyperlipidemia) 11/11/2017  . HTN (hypertension) 11/11/2017  . Hypokalemia 11/11/2017  . Stroke [facility]) 11/10/2017    Past Surgical History:  Procedure Laterality Date  . LOOP RECORDER INSERTION N/A 11/11/2017   Procedure: LOOP RECORDER INSERTION;  Surgeon: Constance Haw, MD;  Location: [facility] CV LAB;  Service: Cardiovascular;  Laterality: N/A;  . TEE WITHOUT CARDIOVERSION N/A 11/11/2017   Procedure: TRANSESOPHAGEAL ECHOCARDIOGRAM (TEE) with loop;  Surgeon: Acie Fredrickson  Wonda Cheng, MD;  Location: [facility] ENDOSCOPY;  Service: Cardiovascular;  Laterality: N/A;     OB History   None      Home Medications    Prior to Admission medications   Medication Sig Start Date End Date Taking? Authorizing Provider  aspirin EC 81 MG EC tablet Take 1 tablet (81 mg total) by mouth daily. 11/11/17   Geradine Girt, DO  atenolol (TENORMIN) 50 MG tablet Take 50 mg by mouth daily. 11/01/17   [provider]  calcium acetate (PHOSLO) 667 MG capsule Take by mouth 3 (three) times daily with meals.    [provider]  calcium carbonate 1250 MG capsule Take 1,250 mg by mouth 2 (two) times daily with a meal.    [provider]  cholecalciferol (VITAMIN D) 1000 units tablet Take 2,000 Units by mouth daily.    [provider]  clopidogrel (PLAVIX) 75 MG tablet Take 1 tablet (75 mg total) by mouth daily. 11/11/17   Geradine Girt, DO  irbesartan (AVAPRO) 300 MG tablet Take 300 mg by mouth daily. 10/12/17   [provider]  nortriptyline (PAMELOR) 25 MG capsule Take 25 mg by mouth at bedtime. 10/31/17   [provider]  pravastatin (PRAVACHOL) 80 MG tablet Take 1 tablet (80 mg total) by mouth daily at 6 PM. 11/11/17   Geradine Girt, DO  PREMPRO 0.625-2.5  MG tablet Take 1 tablet by mouth at bedtime. 11/03/17   [provider]  telmisartan (MICARDIS) 80 MG tablet Take 80 mg by mouth daily.    [provider]  triamterene-hydrochlorothiazide (MAXZIDE) 75-50 MG tablet Take 1 tablet by mouth daily. 09/29/17   [provider]  vitamin E (VITAMIN E) 400 UNIT capsule Take 800 Units by mouth daily.    [provider]  zolpidem (AMBIEN) 10 MG tablet Take 10 mg by mouth at bedtime.  11/03/17   [provider]    Family History Family History  Problem Relation Age of Onset  . Heart disease Father     Social History Social History   Tobacco Use  . Smoking status: Never Smoker  . Smokeless tobacco: Never  Used  Substance Use Topics  . Alcohol use: Yes  . Drug use: No     Allergies   Codeine   Review of Systems Review of Systems  Constitutional: Positive for fatigue. Negative for chills, diaphoresis and fever.  HENT: Negative for congestion, rhinorrhea and sneezing.   Eyes: Negative.   Respiratory: Negative for cough, chest tightness and shortness of breath.   Cardiovascular: Positive for chest pain and leg swelling.  Gastrointestinal: Negative for abdominal pain, blood in stool, diarrhea, nausea and vomiting.  Genitourinary: Negative for difficulty urinating, flank pain, frequency and hematuria.  Musculoskeletal: Negative for arthralgias and back pain.  Skin: Negative for rash.  Neurological: Positive for weakness. Negative for dizziness, speech difficulty, numbness and headaches.     Physical Exam Updated Vital Signs BP 134/71   Pulse 95   Temp 98.7 F (37.1 C) (Oral)   Resp 17   Ht 5\' 8"  (1.727 m)   Wt 86.2 kg (190 lb)   SpO2 96%   BMI 28.89 kg/m   Physical Exam  Constitutional: She is oriented to person, place, and time. She appears well-developed and well-nourished.  HENT:  Head: Normocephalic and atraumatic.  Eyes: Pupils are equal, round, and reactive to light.  Neck: Normal range of motion. Neck supple.  Cardiovascular: Normal rate, regular rhythm and normal heart sounds.  Pulmonary/Chest: Effort normal and breath sounds normal. No respiratory distress. She has no wheezes. She has no rales. She exhibits no tenderness.  Abdominal: Soft. Bowel sounds are normal. There is no tenderness. There is no rebound and no guarding.  Musculoskeletal: Normal range of motion. She exhibits edema.  Trace edema to her ankles, no calf tenderness  Lymphadenopathy:    She has no cervical adenopathy.  Neurological: She is alert and oriented to person, place, and time.  Motor 5/5 all extremities Sensation grossly intact to LT all extremities Finger to Nose intact, no pronator  drift CN II-XII grossly intact    Skin: Skin is warm and dry. No rash noted.  Psychiatric: She has a normal mood and affect.     ED Treatments / Results  Labs (all labs ordered are listed, but only abnormal results are displayed) Labs Reviewed  BASIC METABOLIC PANEL - Abnormal; Notable for the following components:      Result Value   Potassium 3.0 (*)    Chloride 100 (*)    Glucose, Bld 106 (*)    All other components within normal limits  URINALYSIS, ROUTINE W REFLEX MICROSCOPIC - Abnormal; Notable for the following components:   Hgb urine dipstick SMALL (*)    Bacteria, UA RARE (*)    Squamous Epithelial / LPF 0-5 (*)    All other components within  normal limits  CBC  D-DIMER, QUANTITATIVE (NOT AT [facility])  I-STAT TROPONIN, ED    EKG EKG Interpretation  Date/Time:  Thursday December 04 2017 19:07:10 EDT Ventricular Rate:  105 PR Interval:  166 QRS Duration: 76 QT Interval:  348 QTC Calculation: 459 R Axis:   70 Text Interpretation:  Sinus tachycardia with Premature atrial complexes Otherwise normal ECG SINCE LAST TRACING HEART RATE HAS INCREASED Confirmed by Malvin Johns [phone]) on 12/04/2017 11:45:37 PM   Radiology Dg Chest 2 View  Result Date: 12/04/2017 CLINICAL DATA:  Chest palpitations today.  Hypertension. EXAM: CHEST - 2 VIEW COMPARISON:  11/09/2017 FINDINGS: The heart size and mediastinal contours are within normal limits. Loop recorder projects the left anterior chest wall. This is a new finding since prior. Minimal atelectasis at the left lung base. No overt pulmonary edema or pulmonary consolidation. No effusion or pneumothorax. Stable degenerative change along the thoracic spine. IMPRESSION: No active cardiopulmonary disease. Loop recording device projects over the anterior left chest wall. Electronically Signed   By: Ashley Royalty M.D.   On: 12/04/2017 19:35   Ct Head Wo Contrast  Result Date: 12/05/2017 CLINICAL DATA:  Ataxia.  Stroke suspected. EXAM: CT  HEAD WITHOUT CONTRAST TECHNIQUE: Contiguous axial images were obtained from the base of the skull through the vertex without intravenous contrast. COMPARISON:  Head CT and brain MRI 11/09/2017 and 11/10/2017 respectively FINDINGS: Brain: Right cerebellar infarct on MRI is not well seen by CT. No evidence of acute ischemia. Stable generalized atrophy and mild chronic small vessel ischemia. No hemorrhage, mass effect, or midline shift. Vascular: No hyperdense vessel or unexpected calcification. Mild skull base atherosclerosis. Skull: No fracture or focal lesion. Sinuses/Orbits: Paranasal sinuses and mastoid air cells are clear. The visualized orbits are unremarkable. Bilateral cataract resection. Other: None. IMPRESSION: 1. Small right cerebellar acute infarct on MRI last month is not seen by CT. No evidence of acute ischemia or other acute abnormality. 2. Atrophy and chronic small vessel ischemia. Electronically Signed   By: Jeb Levering M.D.   On: 12/05/2017 02:26    Procedures Procedures (including critical care time)  Medications Ordered in ED Medications  potassium chloride SA (K-DUR,KLOR-CON) CR tablet 40 mEq (40 mEq Oral Given 12/05/17 0108)  sodium chloride 0.9 % bolus 500 mL (0 mLs Intravenous Stopped 12/05/17 0352)     Initial Impression / Assessment and Plan / ED Course  I have reviewed the triage vital signs and the nursing notes.  Pertinent labs & imaging results that were available during my care of the patient were reviewed by me and considered in my medical decision making (see chart for details).     Patient is a 72 year old female who presents with palpitations.  She generally has been feeling a little weaker than normal over the last 2 weeks or so since she had a recent stroke.  She does not have any unilateral findings.  No acute neurologic deficits.  No current dizziness or ataxia.  Her labs are non-concerning.  Her EKG shows a sinus tachycardia.  Her heart rate improved to  the 80s and 90s.  She has no hypoxia or shortness of breath.  She had a slightly low potassium and was given potassium replacement.  She has no EKG changes or suggestions of cardiac ischemia.  Her d-dimer is negative and she has no other suggestions of pulmonary embolus.  She is able to ambulate without symptoms.  Her loop recorder was interrogated and [employer] advises [place] that there was  no abnormal arrhythmias.  She was discharged home in good condition.  She was encouraged to follow-up with her PCP.  Return precautions were given.  Final Clinical Impressions(s) / ED Diagnoses   Final diagnoses:  Palpitations    ED Discharge Orders    None       Malvin Johns, MD 12/05/17 [phone]

## 2017-12-11 ENCOUNTER — Ambulatory Visit (INDEPENDENT_AMBULATORY_CARE_PROVIDER_SITE_OTHER): Payer: Medicare Other | Admitting: *Deleted

## 2017-12-11 DIAGNOSIS — I6300 Cerebral infarction due to thrombosis of unspecified precerebral artery: Secondary | ICD-10-CM

## 2017-12-12 NOTE — Progress Notes (Signed)
[facility] Summary Report / Loop Recorder 

## 2017-12-16 DIAGNOSIS — E782 Mixed hyperlipidemia: Secondary | ICD-10-CM | POA: Diagnosis not present

## 2017-12-16 DIAGNOSIS — I10 Essential (primary) hypertension: Secondary | ICD-10-CM | POA: Diagnosis not present

## 2017-12-16 DIAGNOSIS — Z8639 Personal history of other endocrine, nutritional and metabolic disease: Secondary | ICD-10-CM | POA: Diagnosis not present

## 2017-12-25 ENCOUNTER — Encounter: Payer: Self-pay | Admitting: Adult Health

## 2017-12-25 ENCOUNTER — Ambulatory Visit (INDEPENDENT_AMBULATORY_CARE_PROVIDER_SITE_OTHER): Payer: Medicare Other | Admitting: Adult Health

## 2017-12-25 VITALS — BP 122/73 | HR 70 | Ht 68.0 in | Wt 188.8 lb

## 2017-12-25 DIAGNOSIS — E785 Hyperlipidemia, unspecified: Secondary | ICD-10-CM

## 2017-12-25 DIAGNOSIS — I10 Essential (primary) hypertension: Secondary | ICD-10-CM | POA: Diagnosis not present

## 2017-12-25 DIAGNOSIS — I63431 Cerebral infarction due to embolism of right posterior cerebral artery: Secondary | ICD-10-CM | POA: Diagnosis not present

## 2017-12-25 NOTE — Patient Instructions (Signed)
Continue clopidogrel 75 mg daily  and pravachol  for secondary stroke prevention  Continue to follow up with PCP regarding cholesterol, blood pressure and CAD  Continue to monitor loop recorder  Continue to monitor blood pressure at home  Maintain strict control of hypertension with blood pressure goal below 130/90, diabetes with hemoglobin A1c goal below 6.5% and cholesterol with LDL cholesterol (bad cholesterol) goal below 70 mg/dL. I also advised the patient to eat a healthy diet with plenty of whole grains, cereals, fruits and vegetables, exercise regularly and maintain ideal body weight.  Followup in the future with me in 4 months or call earlier if needed

## 2017-12-25 NOTE — Progress Notes (Signed)
[facility] [address] [place]. [place] [address] [phone]       OFFICE FOLLOW UP NOTE  [name] Date of Birth:  Jun 07, 1946 Medical Record Number:  [id number]   Reason for Referral:  hospital stroke follow up  CHIEF COMPLAINT:  Chief Complaint  Patient presents with  . Follow-up    Stroke follow up, pt was seen in Dr. Leonie Man in the hospital     HPI: Cassandra Bailey is being seen today for initial visit in the office for right PICA territory infarcts on 11/09/17. History obtained from patient and chart review. Reviewed all radiology images and labs personally.  Cassandra Moncure Kimreyis an 72 y.o.femalepast medical history of hypertension, hyperlipidemia, anxiety and migraine presents to the hospital after having sudden onset episode of dizziness and gait imbalance at 8:15 in the morning while she was walking her dog. She stated that her symptoms occured suddenly, denied sensation of spinning, but just felt off balance. She leaned against her car on the left side and then had a fall resulting in pain in her hip. She walked back to her house and and laid in bed for 3 hours. Her son encouraged her to come to the hospital. She denies any ringing sensation in her ears, slurred speech, double vision. She felt completely back to herself. CT head showeddecreased attenuation in the left medial pons-midbrain. MRI brain reviewed and showed patchy small volume acute ischemic nonhemorrhagic infarcts involving the inferior right cerebellar hemisphere, right PICA territory. MRA head negative for intracranial stenosis. Carotid doppler unremarkable. 2D echo unremarkable for clot and normal EF. TEE negative for thrombus and PFO. Loop recorder placed on 11/11/17. LDL 129 and A1c 5.9. Recommended starting Pravachol 40mg  daily. Recommended DAPT of aspirin and plavix for 3 weeks and then Plavix alone. Patient discharged home in stable condition.   Since discharge, patient has been doing  well. Continues to take plavix with mild rectal bleeding but just noticed this this AM. She describes it was bright red blood and small amts. Advised her to keep an eye on this and notify if it worsens as this could be from fissure or hemorrhoids. Continues to take Pavachol without side effects of myalgias. Blood pressure satisfactory at 122/73. PCP has been adjusting antihypertensives. Concerned for infrequent episodes of facial flushing, feeling "clamy" and facial twitching. She has noticed these happen when she becomes stressed. Advised patient to speak with PCP regarding these. She does have some moments with increased dizziness/lightheadedness but this is subsiding. Pt has returned back to previous job and doing all previous activities.    ROS:   14 system review of systems performed and negative with exception of fatigue, palpitations, blood in stool, ringing in ears moles, eye pain, flushing, joint pain, weakness and anxiety  PMH:  Past Medical History:  Diagnosis Date  . Coronary artery disease   . Hypertension   . Stroke [facility])     PSH:  Past Surgical History:  Procedure Laterality Date  . LOOP RECORDER INSERTION N/A 11/11/2017   Procedure: LOOP RECORDER INSERTION;  Surgeon: Constance Haw, MD;  Location: [place] CV LAB;  Service: Cardiovascular;  Laterality: N/A;  . TEE WITHOUT CARDIOVERSION N/A 11/11/2017   Procedure: TRANSESOPHAGEAL ECHOCARDIOGRAM (TEE) with loop;  Surgeon: Acie Fredrickson Wonda Cheng, MD;  Location: [facility] ENDOSCOPY;  Service: Cardiovascular;  Laterality: N/A;    Social History:  Social History   Socioeconomic History  . Marital status: Divorced    Spouse name: Not on  file  . Number of children: Not on file  . Years of education: Not on file  . Highest education level: Not on file  Occupational History  . Not on file  Social Needs  . Financial resource strain: Not on file  . Food insecurity:    Worry: Not on file    Inability: Not on file  . Transportation  needs:    Medical: Not on file    Non-medical: Not on file  Tobacco Use  . Smoking status: Never Smoker  . Smokeless tobacco: Never Used  Substance and Sexual Activity  . Alcohol use: Yes    Comment: socially  . Drug use: No  . Sexual activity: Not on file  Lifestyle  . Physical activity:    Days per week: Not on file    Minutes per session: Not on file  . Stress: Not on file  Relationships  . Social connections:    Talks on phone: Not on file    Gets together: Not on file    Attends religious service: Not on file    Active member of club or organization: Not on file    Attends meetings of clubs or organizations: Not on file    Relationship status: Not on file  . Intimate partner violence:    Fear of current or ex partner: Not on file    Emotionally abused: Not on file    Physically abused: Not on file    Forced sexual activity: Not on file  Other Topics Concern  . Not on file  Social History Narrative  . Not on file    Family History:  Family History  Problem Relation Age of Onset  . Heart disease Father     Medications:   Current Outpatient Medications on File Prior to Visit  Medication Sig Dispense Refill  . atenolol (TENORMIN) 50 MG tablet Take 50 mg by mouth daily.  3  . calcium acetate (PHOSLO) 667 MG capsule Take by mouth 3 (three) times daily with meals.    . calcium carbonate 1250 MG capsule Take 1,000 mg by mouth 2 (two) times daily with a meal.     . cholecalciferol (VITAMIN D) 1000 units tablet Take 2,000 Units by mouth daily.    . clonazePAM (KLONOPIN) 0.5 MG tablet TK 1 T PO TID PRN  0  . clopidogrel (PLAVIX) 75 MG tablet Take 1 tablet (75 mg total) by mouth daily. 30 tablet 0  . Flaxseed, Linseed, (FLAXSEED OIL PO) Take 1,000 mg by mouth 2 (two) times daily.    . nortriptyline (PAMELOR) 25 MG capsule Take 25 mg by mouth at bedtime.  3  . Omega-3 Fatty Acids (FISH OIL PO) Take 1,000 mg by mouth 2 (two) times daily.    . pravastatin (PRAVACHOL) 40 MG  tablet Take 40 mg by mouth daily.  2  . PREMPRO 0.625-2.5 MG tablet Take 1 tablet by mouth at bedtime.  1  . triamterene-hydrochlorothiazide (MAXZIDE) 75-50 MG tablet Take 1 tablet by mouth daily.  0  . vitamin E (VITAMIN E) 400 UNIT capsule Take 800 Units by mouth daily.    Marland Kitchen zolpidem (AMBIEN) 10 MG tablet Take 10 mg by mouth at bedtime.   1   No current facility-administered medications on file prior to visit.     Allergies:   Allergies  Allergen Reactions  . Codeine Nausea And Vomiting     Physical Exam  Vitals:   12/25/17 1032  BP: 122/73  Pulse:  70  Weight: 188 lb 12.8 oz (85.6 kg)  Height: 5\' 8"  (1.727 m)   Body mass index is 28.71 kg/m. No exam data present  General: well developed, well nourished, seated, in no evident distress Head: head normocephalic and atraumatic.   Neck: supple with no carotid or supraclavicular bruits Cardiovascular: regular rate and rhythm, no murmurs Musculoskeletal: no deformity Skin:  no rash/petichiae Vascular:  Normal pulses all extremities  Neurologic Exam Mental Status: Awake and fully alert. Oriented to place and time. Recent and remote memory intact. Attention span, concentration and fund of knowledge appropriate. Mood and affect appropriate.  No flowsheet data found. Cranial Nerves: Fundoscopic exam reveals sharp disc margins. Pupils equal, briskly reactive to light. Extraocular movements full without nystagmus. Visual fields full to confrontation. Hearing intact. Facial sensation intact. Face, tongue, palate moves normally and symmetrically.  Motor: Normal bulk and tone. Normal strength in all tested extremity muscles. Sensory.: intact to touch , pinprick , position and vibratory sensation.  Coordination: Rapid alternating movements normal in all extremities. Finger-to-nose and heel-to-shin performed accurately bilaterally. Gait and Station: Arises from chair without difficulty. Stance is normal. Gait demonstrates normal stride  length and balance . Able to heel, toe and tandem walk without difficulty.  Reflexes: 1+ and symmetric. Toes downgoing.    NIHSS  0 Modified Rankin  1    Diagnostic Data (Labs, Imaging, Testing)  Dg Ribs Unilateral W/chest Left 11/09/2017 IMPRESSION:  1. No visualized rib fracture or pulmonary complication.  2. Minimal subsegmental left basilar atelectasis.   Ct Head Wo Contrast 11/09/2017 IMPRESSION:  Age uncertain but potentially recent infarct in the medial left pons-midbrain junction. There is slight periventricular small vessel disease. Elsewhere gray-white compartments appear normal. No mass or hemorrhage. There are foci of arterial vascular calcification.   Mr Jodene Nam Head Wo Contrast 11/10/2017 IMPRESSION:   MRI HEAD IMPRESSION: 1. Patchy small volume acute ischemic nonhemorrhagic infarcts involving the inferior right cerebellar hemisphere, right PICA territory.  2. Age-related cerebral atrophy with moderate chronic small vessel ischemic disease.   MRA HEAD IMPRESSION:  Negative intracranial MRA. No large vessel occlusion. No high-grade proximal or correctable stenosis.    Dg Hip Unilat With Pelvis 2-3 Views Left 11/09/2017 IMPRESSION:  1. No acute osseous abnormality about the left hip.  2. Moderate degenerative osteoarthrosis.   Transthoracic Echocardiogram -  Left ventricle: The cavity size was normal. There was mild concentric hypertrophy. Systolic function was vigorous. The estimated ejection fraction was in the range of 65% to 70%. Wall motion was normal; there were no regional wall motion abnormalities.  TEE 11/11/17 Study Conclusions - Left ventricle: The cavity size was normal. Wall thickness was   normal. Systolic function was normal. - Aortic valve: No evidence of vegetation. There was mild   regurgitation. - Mitral valve: No evidence of vegetation. - Left atrium: No evidence of thrombus in the atrial cavity or   appendage. - Atrial  septum: No defect or patent foramen ovale was identified.      ASSESSMENT: Cassandra Bailey is a 72 y.o. 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 Vascular risk factors include HTN, HLD and CAD.     PLAN: -Continue clopidogrel 75 mg daily  and pravachol  for secondary stroke prevention -F/u with PCP regarding your HLD, HTN and CAD management -continue to monitor loop recorder -continue to monitor BP at home -Maintain strict control of hypertension with blood pressure goal below 130/90, diabetes with hemoglobin A1c goal below 6.5%  and cholesterol with LDL cholesterol (bad cholesterol) goal below 70 mg/dL. I also advised the patient to eat a healthy diet with plenty of whole grains, cereals, fruits and vegetables, exercise regularly and maintain ideal body weight.  Follow up in 4 months or call earlier if needed   Greater than 50% time during this 25 minute consultation visit was spent on counseling and coordination of care about HLD, HTN and CAD, discussion about risk benefit of anticoagulation and answering questions.     Venancio Poisson, AGNP-BC  [facility] [address] [place], [place] [address]  Phone [phone] Fax [phone]

## 2017-12-26 ENCOUNTER — Other Ambulatory Visit: Payer: Self-pay | Admitting: Cardiology

## 2017-12-26 NOTE — Progress Notes (Signed)
I agree with the above plan 

## 2017-12-29 DIAGNOSIS — E782 Mixed hyperlipidemia: Secondary | ICD-10-CM | POA: Diagnosis not present

## 2017-12-29 DIAGNOSIS — N950 Postmenopausal bleeding: Secondary | ICD-10-CM | POA: Diagnosis not present

## 2017-12-29 DIAGNOSIS — I10 Essential (primary) hypertension: Secondary | ICD-10-CM | POA: Diagnosis not present

## 2018-01-09 LAB — CUP PACEART REMOTE DEVICE CHECK
Date Time Interrogation Session: 20190418153701
Implantable Pulse Generator Implant Date: 20190319

## 2018-01-13 ENCOUNTER — Ambulatory Visit (INDEPENDENT_AMBULATORY_CARE_PROVIDER_SITE_OTHER): Payer: Medicare Other | Admitting: *Deleted

## 2018-01-13 DIAGNOSIS — I6300 Cerebral infarction due to thrombosis of unspecified precerebral artery: Secondary | ICD-10-CM | POA: Diagnosis not present

## 2018-01-14 NOTE — Progress Notes (Signed)
[facility] Summary Report / Loop Recorder 

## 2018-01-15 DIAGNOSIS — Z113 Encounter for screening for infections with a predominantly sexual mode of transmission: Secondary | ICD-10-CM | POA: Diagnosis not present

## 2018-01-15 DIAGNOSIS — N950 Postmenopausal bleeding: Secondary | ICD-10-CM | POA: Diagnosis not present

## 2018-01-15 DIAGNOSIS — Z124 Encounter for screening for malignant neoplasm of cervix: Secondary | ICD-10-CM | POA: Diagnosis not present

## 2018-01-15 DIAGNOSIS — Z01411 Encounter for gynecological examination (general) (routine) with abnormal findings: Secondary | ICD-10-CM | POA: Diagnosis not present

## 2018-01-15 DIAGNOSIS — Z01419 Encounter for gynecological examination (general) (routine) without abnormal findings: Secondary | ICD-10-CM | POA: Diagnosis not present

## 2018-01-15 DIAGNOSIS — I10 Essential (primary) hypertension: Secondary | ICD-10-CM | POA: Diagnosis not present

## 2018-01-15 DIAGNOSIS — Z1231 Encounter for screening mammogram for malignant neoplasm of breast: Secondary | ICD-10-CM | POA: Diagnosis not present

## 2018-01-15 DIAGNOSIS — Z1151 Encounter for screening for human papillomavirus (HPV): Secondary | ICD-10-CM | POA: Diagnosis not present

## 2018-01-22 DIAGNOSIS — I10 Essential (primary) hypertension: Secondary | ICD-10-CM | POA: Diagnosis not present

## 2018-01-22 DIAGNOSIS — Z780 Asymptomatic menopausal state: Secondary | ICD-10-CM | POA: Diagnosis not present

## 2018-01-22 DIAGNOSIS — E782 Mixed hyperlipidemia: Secondary | ICD-10-CM | POA: Diagnosis not present

## 2018-01-22 DIAGNOSIS — I639 Cerebral infarction, unspecified: Secondary | ICD-10-CM | POA: Diagnosis not present

## 2018-01-30 DIAGNOSIS — N950 Postmenopausal bleeding: Secondary | ICD-10-CM | POA: Diagnosis not present

## 2018-01-30 DIAGNOSIS — N840 Polyp of corpus uteri: Secondary | ICD-10-CM | POA: Diagnosis not present

## 2018-01-30 DIAGNOSIS — R87610 Atypical squamous cells of undetermined significance on cytologic smear of cervix (ASC-US): Secondary | ICD-10-CM | POA: Diagnosis not present

## 2018-01-30 DIAGNOSIS — N841 Polyp of cervix uteri: Secondary | ICD-10-CM | POA: Diagnosis not present

## 2018-01-30 DIAGNOSIS — R9389 Abnormal findings on diagnostic imaging of other specified body structures: Secondary | ICD-10-CM | POA: Diagnosis not present

## 2018-02-09 LAB — CUP PACEART REMOTE DEVICE CHECK
Implantable Pulse Generator Implant Date: 20190319
MDC IDC SESS DTM: 20190521160656

## 2018-02-13 DIAGNOSIS — N950 Postmenopausal bleeding: Secondary | ICD-10-CM | POA: Diagnosis not present

## 2018-02-13 DIAGNOSIS — R9389 Abnormal findings on diagnostic imaging of other specified body structures: Secondary | ICD-10-CM | POA: Diagnosis not present

## 2018-02-16 ENCOUNTER — Ambulatory Visit (INDEPENDENT_AMBULATORY_CARE_PROVIDER_SITE_OTHER): Payer: Medicare Other | Admitting: *Deleted

## 2018-02-16 DIAGNOSIS — I6300 Cerebral infarction due to thrombosis of unspecified precerebral artery: Secondary | ICD-10-CM | POA: Diagnosis not present

## 2018-02-16 NOTE — Progress Notes (Signed)
[facility] Summary Report / Loop Recorder 

## 2018-03-20 ENCOUNTER — Ambulatory Visit (INDEPENDENT_AMBULATORY_CARE_PROVIDER_SITE_OTHER): Payer: Medicare Other | Admitting: *Deleted

## 2018-03-20 DIAGNOSIS — I6300 Cerebral infarction due to thrombosis of unspecified precerebral artery: Secondary | ICD-10-CM | POA: Diagnosis not present

## 2018-03-20 NOTE — Progress Notes (Signed)
[facility] Summary Report / Loop Recorder 

## 2018-04-02 LAB — CUP PACEART REMOTE DEVICE CHECK
Date Time Interrogation Session: 20190623160819
MDC IDC PG IMPLANT DT: 20190319

## 2018-04-22 ENCOUNTER — Ambulatory Visit (INDEPENDENT_AMBULATORY_CARE_PROVIDER_SITE_OTHER): Payer: Medicare Other | Admitting: *Deleted

## 2018-04-22 DIAGNOSIS — I6300 Cerebral infarction due to thrombosis of unspecified precerebral artery: Secondary | ICD-10-CM

## 2018-04-23 NOTE — Progress Notes (Signed)
[facility] Summary Report / Loop Recorder 

## 2018-04-29 NOTE — Progress Notes (Signed)
[facility] [address] [place]. [place] [address] [phone]       OFFICE FOLLOW UP NOTE  [name] Date of Birth:  July 26, 1946 Medical Record Number:  [id number]   Reason for visit: Stroke follow up  CHIEF COMPLAINT:  Chief Complaint  Patient presents with  . 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 History obtained from patient and chart review. Reviewed all radiology images and labs personally.  Cassandra Kinnamon Kimreyis an 72 y.o.femalepast medical history of hypertension, hyperlipidemia, anxiety and migraine presents to the hospital after having sudden onset episode of dizziness and gait imbalance at 8:15 in the morning while she was walking her dog. She stated that her symptoms occured suddenly, denied sensation of spinning, but just felt off balance. She leaned against her car on the left side and then had a fall resulting in pain in her hip. She walked back to her house and and laid in bed for 3 hours. Her son encouraged her to come to the hospital. She denies any ringing sensation in her ears, slurred speech, double vision. She felt completely back to herself. CT head showeddecreased attenuation in the left medial pons-midbrain. MRI brain reviewed and showed patchy small volume acute ischemic nonhemorrhagic infarcts involving the inferior right cerebellar hemisphere, right PICA territory. MRA head negative for intracranial stenosis. Carotid doppler unremarkable. 2D echo unremarkable for clot and normal EF. TEE negative for thrombus and PFO. Loop recorder placed on 11/11/17. LDL 129 and A1c 5.9. Recommended starting Pravachol 40mg  daily. Recommended DAPT of aspirin and plavix for 3 weeks and then Plavix alone. Patient discharged home in stable condition.   12/25/2017 visit: Since discharge, patient has been doing well.  Continues to take plavix with mild rectal bleeding but just noticed this this AM. She describes it was bright red blood and small amts. Advised her to keep an eye on this and notify if it worsens as this could be from fissure or hemorrhoids. Continues to take Pavachol without side effects of myalgias. Blood pressure satisfactory at 122/73. PCP has been adjusting antihypertensives. Concerned for infrequent episodes of facial flushing, feeling "clamy" and facial twitching. She has noticed these happen when she becomes stressed. Advised patient to speak with PCP regarding these. She does have some moments with increased dizziness/lightheadedness but this is subsiding. Pt has returned back to previous job and doing all previous activities.   Interval history 04/30/2018: Patient is being seen today for scheduled follow-up visit.  She states she is been doing well without residual deficits or symptoms.  States her PCP recently started her on Celebrex due to osteomyelitis and arthritis pain.  She states that even prior to her stroke, she has pain that starts up into her shoulders/tricep area and radiates down to her index finger on her right arm.  She describes it as more of a throbbing sensation and is experience increased pain with increased movement.  She states that Celebrex has helped some but not completely.  Patient continues to take Plavix without side effects of bleeding or bruising.  Continues to take pravastatin without side effects of myalgias.  Blood pressure today satisfactory 109/62.  Patient continues to stay active and maintains a healthy diet.  Loop recorder has not shown atrial fibrillation thus far.  Denies new or worsening stroke/TIA symptoms.  ROS:   14 system review of systems performed and negative with exception of joint pain, back pain and aching muscles  PMH:  Past Medical History:  Diagnosis Date  . Coronary artery disease   . Hypertension   . Stroke [facility])     PSH:  Past  Surgical History:  Procedure Laterality Date  . LOOP RECORDER INSERTION N/A 11/11/2017   Procedure: LOOP RECORDER INSERTION;  Surgeon: Constance Haw, MD;  Location: [facility];  Service: Cardiovascular;  Laterality: N/A;  . TEE WITHOUT CARDIOVERSION N/A 11/11/2017   Procedure: TRANSESOPHAGEAL ECHOCARDIOGRAM (TEE) with loop;  Surgeon: Acie Fredrickson Wonda Cheng, MD;  Location: [facility] ENDOSCOPY;  Service: Cardiovascular;  Laterality: N/A;    Social History:  Social History   Socioeconomic History  . Marital status: Divorced    Spouse name: Not on file  . Number of children: Not on file  . Years of education: Not on file  . Highest education level: Not on file  Occupational History  . Not on file  Social Needs  . Financial resource strain: Not on file  . Food insecurity:    Worry: Not on file    Inability: Not on file  . Transportation needs:    Medical: Not on file    Non-medical: Not on file  Tobacco Use  . Smoking status: Never Smoker  . Smokeless tobacco: Never Used  Substance and Sexual Activity  . Alcohol use: Yes    Comment: socially  . Drug use: No  . Sexual activity: Not on file  Lifestyle  . Physical activity:    Days per week: Not on file    Minutes per session: Not on file  . Stress: Not on file  Relationships  . Social connections:    Talks on phone: Not on file    Gets together: Not on file    Attends religious service: Not on file    Active member of club or organization: Not on file    Attends meetings of clubs or organizations: Not on file    Relationship status: Not on file  . Intimate partner violence:    Fear of current or ex partner: Not on file    Emotionally abused: Not on file    Physically abused: Not on file    Forced sexual activity: Not on file  Other Topics Concern  . Not on file  Social History Narrative  . Not on file    Family History:  Family History  Problem Relation Age of Onset  . Heart disease Father     Medications:     Current Outpatient Medications on File Prior to Visit  Medication Sig Dispense Refill  . atenolol (TENORMIN) 50 MG tablet Take 50 mg by mouth daily.  3  . calcium acetate (PHOSLO) 667 MG capsule Take by mouth 3 (three) times daily with meals.    . calcium carbonate 1250 MG capsule Take 1,000 mg by mouth 2 (two) times daily with a meal.     . celecoxib (CELEBREX) 200 MG capsule Take 200 mg by mouth 2 (two) times daily.    . cholecalciferol (VITAMIN D) 1000 units tablet Take 2,000 Units by mouth daily.    . clonazePAM (KLONOPIN) 0.5 MG tablet TK 1 T PO TID PRN  0  . clopidogrel (PLAVIX) 75 MG tablet Take 1 tablet (75 mg total) by mouth daily. 30 tablet 0  . Flaxseed, Linseed, (FLAXSEED OIL PO) Take 1,000 mg by mouth 2 (two) times  daily.    . nortriptyline (PAMELOR) 25 MG capsule Take 25 mg by mouth at bedtime.  3  . Omega-3 Fatty Acids (FISH OIL PO) Take 1,000 mg by mouth 2 (two) times daily.    . pravastatin (PRAVACHOL) 40 MG tablet Take 40 mg by mouth daily.  2  . pravastatin (PRAVACHOL) 80 MG tablet TK 1 T PO QD  3  . TELMISARTAN PO Take 75 mg by mouth.    . triamterene-hydrochlorothiazide (MAXZIDE) 75-50 MG tablet Take 1 tablet by mouth daily.  0  . vitamin E (VITAMIN E) 400 UNIT capsule Take 800 Units by mouth daily.    Marland Kitchen zolpidem (AMBIEN) 10 MG tablet Take 10 mg by mouth at bedtime.   1  . ezetimibe (ZETIA) 10 MG tablet TK 1 T PO QD  3   No current facility-administered medications on file prior to visit.     Allergies:   Allergies  Allergen Reactions  . Codeine Nausea And Vomiting     Physical Exam  Vitals:   04/30/18 1012  BP: 109/62  Pulse: 79  Weight: 185 lb (83.9 kg)  Height: 5\' 8"  (1.727 m)   Body mass index is 28.13 kg/m. No exam data present  General: well developed, well nourished, pleasant elderly Caucasian female, seated, in no evident distress Head: head normocephalic and atraumatic.   Neck: supple with no carotid or supraclavicular  bruits Cardiovascular: regular rate and rhythm, no murmurs Musculoskeletal: no deformity Skin:  no rash/petichiae Vascular:  Normal pulses all extremities  Neurologic Exam Mental Status: Awake and fully alert. Oriented to place and time. Recent and remote memory intact. Attention span, concentration and fund of knowledge appropriate. Mood and affect appropriate.  Cranial Nerves: Fundoscopic exam reveals sharp disc margins. Pupils equal, briskly reactive to light. Extraocular movements full without nystagmus. Visual fields full to confrontation. Hearing intact. Facial sensation intact. Face, tongue, palate moves normally and symmetrically.  Motor: Normal bulk and tone. Normal strength in all tested extremity muscles. Sensory.: intact to touch , pinprick , position and vibratory sensation.  Coordination: Rapid alternating movements normal in all extremities. Finger-to-nose and heel-to-shin performed accurately bilaterally. Gait and Station: Arises from chair without difficulty. Stance is normal. Gait demonstrates normal stride length and balance . Able to heel, toe and tandem walk without difficulty.  Reflexes: 1+ and symmetric. Toes downgoing.     Diagnostic Data (Labs, Imaging, Testing)  Dg Ribs Unilateral W/chest Left 11/09/2017 IMPRESSION:  1. No visualized rib fracture or pulmonary complication.  2. Minimal subsegmental left basilar atelectasis.   Ct Head Wo Contrast 11/09/2017 IMPRESSION:  Age uncertain but potentially recent infarct in the medial left pons-midbrain junction. There is slight periventricular small vessel disease. Elsewhere gray-white compartments appear normal. No mass or hemorrhage. There are foci of arterial vascular calcification.   Mr Jodene Nam Head Wo Contrast 11/10/2017 IMPRESSION:   MRI HEAD IMPRESSION: 1. Patchy small volume acute ischemic nonhemorrhagic infarcts involving the inferior right cerebellar hemisphere, right PICA territory.  2. Age-related  cerebral atrophy with moderate chronic small vessel ischemic disease.   MRA HEAD IMPRESSION:  Negative intracranial MRA. No large vessel occlusion. No high-grade proximal or correctable stenosis.    Dg Hip Unilat With Pelvis 2-3 Views Left 11/09/2017 IMPRESSION:  1. No acute osseous abnormality about the left hip.  2. Moderate degenerative osteoarthrosis.   Transthoracic Echocardiogram -  Left ventricle: The cavity size was normal. There was mild concentric hypertrophy. Systolic function was vigorous. The estimated ejection fraction was in  the range of 65% to 70%. Wall motion was normal; there were no regional wall motion abnormalities.  TEE 11/11/17 Study Conclusions - Left ventricle: The cavity size was normal. Wall thickness was   normal. Systolic function was normal. - Aortic valve: No evidence of vegetation. There was mild   regurgitation. - Mitral valve: No evidence of vegetation. - Left atrium: No evidence of thrombus in the atrial cavity or   appendage. - Atrial septum: No defect or patent foramen ovale was identified.      ASSESSMENT: SAKSHI SERMONS is a 72 y.o. 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 Vascular risk factors include HTN, HLD and CAD.  Patient returns today for follow-up visit and overall has been doing well without residual deficits or strokelike symptoms.    PLAN: -Continue clopidogrel 75 mg daily  and pravachol  for secondary stroke prevention -Highly recommend stopping Celebrex due to increased risk of cardiovascular accidents such as MI and stroke -advised patient to speak to PCP in regards to change in management for arthritis/osteoarthritis pain or possible need of referral to orthopedics or rheumatologist -Recommend acetaminophen for pain management along with heat/ice -F/u with PCP regarding your HLD, HTN and CAD management  -continue to monitor loop recorder -continue to monitor BP at  home -Maintain strict control of hypertension with blood pressure goal below 130/90, diabetes with hemoglobin A1c goal below 6.5% and cholesterol with LDL cholesterol (bad cholesterol) goal below 70 mg/dL. I also advised the patient to eat a healthy diet with plenty of whole grains, cereals, fruits and vegetables, exercise regularly and maintain ideal body weight.  Follow up in 4 months or call earlier if needed   Greater than 50% time during this 25 minute consultation visit was spent on counseling and coordination of care about HLD, HTN and CAD, discussion about risk benefit of anticoagulation and answering questions.     Venancio Poisson, AGNP-BC  [facility] [address] [place], [place] [address]  Phone [phone] Fax [phone]

## 2018-04-30 ENCOUNTER — Ambulatory Visit (INDEPENDENT_AMBULATORY_CARE_PROVIDER_SITE_OTHER): Payer: Medicare Other | Admitting: Adult Health

## 2018-04-30 ENCOUNTER — Encounter: Payer: Self-pay | Admitting: Adult Health

## 2018-04-30 VITALS — BP 109/62 | HR 79 | Ht 68.0 in | Wt 185.0 lb

## 2018-04-30 DIAGNOSIS — E785 Hyperlipidemia, unspecified: Secondary | ICD-10-CM | POA: Diagnosis not present

## 2018-04-30 DIAGNOSIS — R7303 Prediabetes: Secondary | ICD-10-CM | POA: Diagnosis not present

## 2018-04-30 DIAGNOSIS — I63431 Cerebral infarction due to embolism of right posterior cerebral artery: Secondary | ICD-10-CM | POA: Diagnosis not present

## 2018-04-30 DIAGNOSIS — Z95818 Presence of other cardiac implants and grafts: Secondary | ICD-10-CM

## 2018-04-30 DIAGNOSIS — I10 Essential (primary) hypertension: Secondary | ICD-10-CM

## 2018-04-30 DIAGNOSIS — E782 Mixed hyperlipidemia: Secondary | ICD-10-CM | POA: Diagnosis not present

## 2018-04-30 DIAGNOSIS — Z79899 Other long term (current) drug therapy: Secondary | ICD-10-CM | POA: Diagnosis not present

## 2018-04-30 DIAGNOSIS — I639 Cerebral infarction, unspecified: Secondary | ICD-10-CM | POA: Diagnosis not present

## 2018-04-30 NOTE — Patient Instructions (Addendum)
Continue clopidogrel 75 mg daily  and pravastatin  for secondary stroke prevention  Recommend stopping Celebrex due to increased risk of stroke - consider change in management for arthritis/osteoarthritis pain or possible referral to ortho vs rheumatologist for continued joint pain  Use heat/ice and tylenol (acetaminophen) for continued pain  Continue to monitor loop recorder  Continue to stay active and maintain a healthy diet  Continue to follow up with PCP regarding cholesterol and blood pressure management   Continue to monitor blood pressure at home  Maintain strict control of hypertension with blood pressure goal below 130/90, diabetes with hemoglobin A1c goal below 6.5% and cholesterol with LDL cholesterol (bad cholesterol) goal below 70 mg/dL. I also advised the patient to eat a healthy diet with plenty of whole grains, cereals, fruits and vegetables, exercise regularly and maintain ideal body weight.  Followup in the future with me in 6 months or call earlier if needed       Thank you for coming to see [place] at [facility]. I hope we have been able to provide you high quality care today.  You may receive a patient satisfaction survey over the next few weeks. We would appreciate your feedback and comments so that we may continue to improve ourselves and the health of our patients.

## 2018-05-01 NOTE — Progress Notes (Signed)
I agree with the above plan 

## 2018-05-04 LAB — CUP PACEART REMOTE DEVICE CHECK
MDC IDC PG IMPLANT DT: 20190319
MDC IDC SESS DTM: 20190726164050

## 2018-05-08 DIAGNOSIS — I639 Cerebral infarction, unspecified: Secondary | ICD-10-CM | POA: Diagnosis not present

## 2018-05-08 DIAGNOSIS — Z0000 Encounter for general adult medical examination without abnormal findings: Secondary | ICD-10-CM | POA: Diagnosis not present

## 2018-05-08 DIAGNOSIS — I10 Essential (primary) hypertension: Secondary | ICD-10-CM | POA: Diagnosis not present

## 2018-05-08 DIAGNOSIS — M150 Primary generalized (osteo)arthritis: Secondary | ICD-10-CM | POA: Diagnosis not present

## 2018-05-08 DIAGNOSIS — E782 Mixed hyperlipidemia: Secondary | ICD-10-CM | POA: Diagnosis not present

## 2018-05-08 DIAGNOSIS — Z780 Asymptomatic menopausal state: Secondary | ICD-10-CM | POA: Diagnosis not present

## 2018-05-19 LAB — CUP PACEART REMOTE DEVICE CHECK
Date Time Interrogation Session: 20190828173759
MDC IDC PG IMPLANT DT: 20190319

## 2018-05-25 ENCOUNTER — Ambulatory Visit (INDEPENDENT_AMBULATORY_CARE_PROVIDER_SITE_OTHER): Payer: Medicare Other | Admitting: *Deleted

## 2018-05-25 DIAGNOSIS — I6300 Cerebral infarction due to thrombosis of unspecified precerebral artery: Secondary | ICD-10-CM

## 2018-05-26 NOTE — Progress Notes (Signed)
[facility] Summary Report / Loop Recorder 

## 2018-05-27 LAB — CUP PACEART REMOTE DEVICE CHECK
Implantable Pulse Generator Implant Date: 20190319
MDC IDC SESS DTM: 20190930174053

## 2018-05-28 DIAGNOSIS — M150 Primary generalized (osteo)arthritis: Secondary | ICD-10-CM | POA: Diagnosis not present

## 2018-05-28 DIAGNOSIS — E782 Mixed hyperlipidemia: Secondary | ICD-10-CM | POA: Diagnosis not present

## 2018-05-28 DIAGNOSIS — I10 Essential (primary) hypertension: Secondary | ICD-10-CM | POA: Diagnosis not present

## 2018-05-28 DIAGNOSIS — I639 Cerebral infarction, unspecified: Secondary | ICD-10-CM | POA: Diagnosis not present

## 2018-06-29 ENCOUNTER — Ambulatory Visit (INDEPENDENT_AMBULATORY_CARE_PROVIDER_SITE_OTHER): Payer: Medicare Other | Admitting: *Deleted

## 2018-06-29 DIAGNOSIS — I6300 Cerebral infarction due to thrombosis of unspecified precerebral artery: Secondary | ICD-10-CM

## 2018-06-29 NOTE — Progress Notes (Signed)
[facility] Summary Report / Loop Recorder 

## 2018-07-21 LAB — CUP PACEART REMOTE DEVICE CHECK
Date Time Interrogation Session: 20191102173903
MDC IDC PG IMPLANT DT: 20190319

## 2018-07-30 ENCOUNTER — Ambulatory Visit (INDEPENDENT_AMBULATORY_CARE_PROVIDER_SITE_OTHER): Payer: Medicare Other

## 2018-07-30 DIAGNOSIS — I6300 Cerebral infarction due to thrombosis of unspecified precerebral artery: Secondary | ICD-10-CM | POA: Diagnosis not present

## 2018-07-31 NOTE — Progress Notes (Signed)
[facility] Summary Report / Loop Recorder 

## 2018-09-01 ENCOUNTER — Ambulatory Visit (INDEPENDENT_AMBULATORY_CARE_PROVIDER_SITE_OTHER): Payer: Medicare Other

## 2018-09-01 DIAGNOSIS — I6300 Cerebral infarction due to thrombosis of unspecified precerebral artery: Secondary | ICD-10-CM

## 2018-09-02 LAB — CUP PACEART REMOTE DEVICE CHECK
Date Time Interrogation Session: 20200107190727
Implantable Pulse Generator Implant Date: 20190319

## 2018-09-02 NOTE — Progress Notes (Signed)
[facility] Summary Report / Loop Recorder 

## 2018-09-13 LAB — CUP PACEART REMOTE DEVICE CHECK
Date Time Interrogation Session: 20191205184002
Implantable Pulse Generator Implant Date: 20190319

## 2018-10-05 ENCOUNTER — Ambulatory Visit (INDEPENDENT_AMBULATORY_CARE_PROVIDER_SITE_OTHER): Payer: Medicare Other

## 2018-10-05 DIAGNOSIS — I6300 Cerebral infarction due to thrombosis of unspecified precerebral artery: Secondary | ICD-10-CM | POA: Diagnosis not present

## 2018-10-06 LAB — CUP PACEART REMOTE DEVICE CHECK
Date Time Interrogation Session: 20200209191208
Implantable Pulse Generator Implant Date: 20190319

## 2018-10-07 ENCOUNTER — Encounter: Payer: Self-pay | Admitting: Adult Health

## 2018-10-16 NOTE — Progress Notes (Signed)
[facility] Summary Report / Loop Recorder 

## 2018-10-30 ENCOUNTER — Ambulatory Visit: Payer: Medicare Other | Admitting: Adult Health

## 2018-11-06 ENCOUNTER — Ambulatory Visit (INDEPENDENT_AMBULATORY_CARE_PROVIDER_SITE_OTHER): Payer: Medicare Other | Admitting: *Deleted

## 2018-11-06 DIAGNOSIS — I639 Cerebral infarction, unspecified: Secondary | ICD-10-CM | POA: Diagnosis not present

## 2018-11-06 DIAGNOSIS — I6300 Cerebral infarction due to thrombosis of unspecified precerebral artery: Secondary | ICD-10-CM

## 2018-11-06 DIAGNOSIS — Z131 Encounter for screening for diabetes mellitus: Secondary | ICD-10-CM | POA: Diagnosis not present

## 2018-11-06 DIAGNOSIS — R300 Dysuria: Secondary | ICD-10-CM | POA: Diagnosis not present

## 2018-11-06 DIAGNOSIS — I10 Essential (primary) hypertension: Secondary | ICD-10-CM | POA: Diagnosis not present

## 2018-11-06 DIAGNOSIS — E782 Mixed hyperlipidemia: Secondary | ICD-10-CM | POA: Diagnosis not present

## 2018-11-07 LAB — CUP PACEART REMOTE DEVICE CHECK
Date Time Interrogation Session: 20200313193914
Implantable Pulse Generator Implant Date: 20190319

## 2018-11-09 ENCOUNTER — Other Ambulatory Visit (HOSPITAL_COMMUNITY): Payer: Self-pay | Admitting: Internal Medicine

## 2018-11-09 ENCOUNTER — Encounter (HOSPITAL_COMMUNITY): Payer: Self-pay

## 2018-11-09 ENCOUNTER — Other Ambulatory Visit: Payer: Self-pay

## 2018-11-09 ENCOUNTER — Ambulatory Visit (HOSPITAL_COMMUNITY)
Admission: RE | Admit: 2018-11-09 | Discharge: 2018-11-09 | Disposition: A | Discharge status: Home / Self Care | Payer: Medicare Other | Source: Ambulatory Visit | Attending: Internal Medicine | Admitting: Internal Medicine

## 2018-11-09 DIAGNOSIS — I639 Cerebral infarction, unspecified: Secondary | ICD-10-CM | POA: Diagnosis not present

## 2018-11-09 DIAGNOSIS — N200 Calculus of kidney: Secondary | ICD-10-CM | POA: Diagnosis not present

## 2018-11-09 DIAGNOSIS — E782 Mixed hyperlipidemia: Secondary | ICD-10-CM | POA: Diagnosis not present

## 2018-11-09 DIAGNOSIS — R109 Unspecified abdominal pain: Secondary | ICD-10-CM | POA: Diagnosis not present

## 2018-11-09 DIAGNOSIS — R300 Dysuria: Secondary | ICD-10-CM | POA: Diagnosis not present

## 2018-11-09 DIAGNOSIS — R319 Hematuria, unspecified: Secondary | ICD-10-CM | POA: Diagnosis not present

## 2018-11-09 DIAGNOSIS — I10 Essential (primary) hypertension: Secondary | ICD-10-CM | POA: Diagnosis not present

## 2018-11-09 DIAGNOSIS — M150 Primary generalized (osteo)arthritis: Secondary | ICD-10-CM | POA: Diagnosis not present

## 2018-11-13 NOTE — Progress Notes (Signed)
[facility] Summary Report / Loop Recorder 

## 2018-11-20 DIAGNOSIS — R310 Gross hematuria: Secondary | ICD-10-CM | POA: Diagnosis not present

## 2018-11-28 IMAGING — CT CT HEAD W/O CM
4 series · 17 of 47 positions shown, 19 images · non-contrast
Comparison: Head CT and brain MRI 11/09/2017 and 11/10/2017
respectively

CLINICAL DATA: Ataxia.  Stroke suspected.

EXAM:
CT HEAD WITHOUT CONTRAST
TECHNIQUE: Contiguous axial images were obtained from the base of the skull
through the vertex without intravenous contrast.

[Series 3: head without · axial · non-contrast · 0.45mm/px · z∈[-127,-2]mm · 7 of 35 slices shown, 9 images]
[im 5/35  brain]
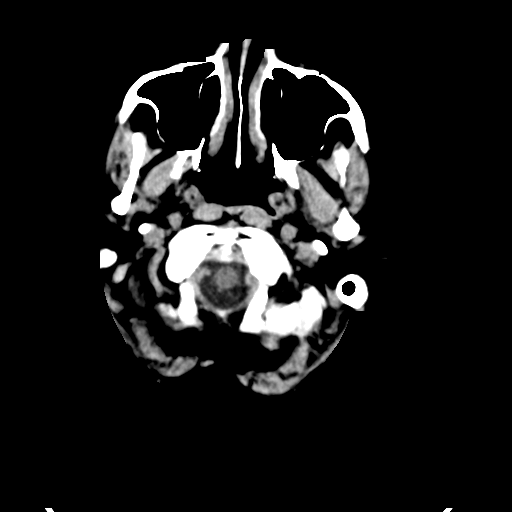
[im 5/35  bone]
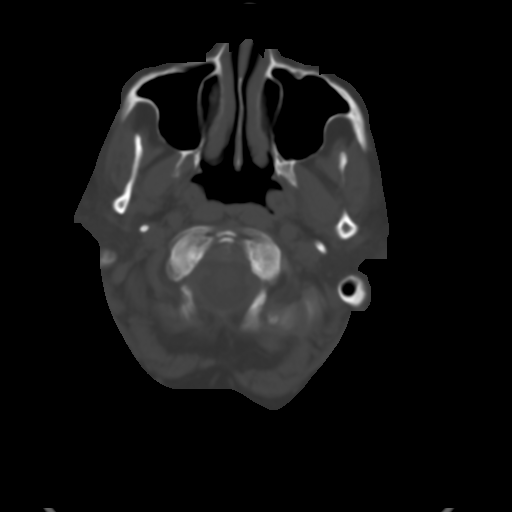
[im 9/35  brain]
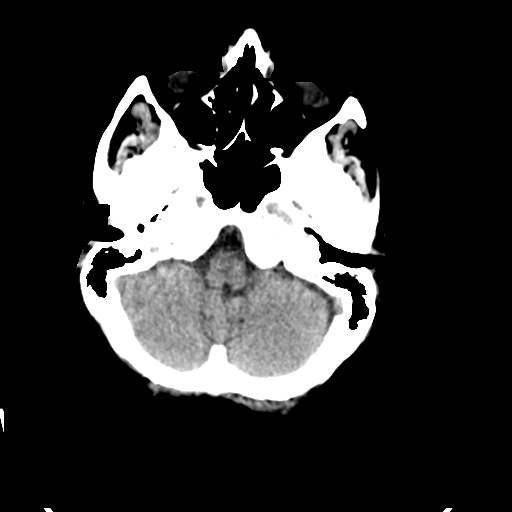
[im 13/35  brain]
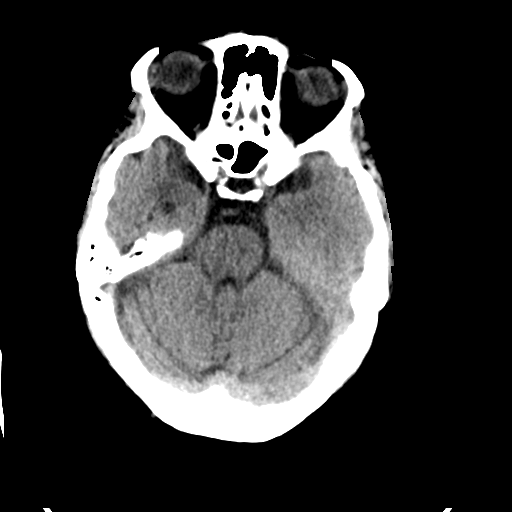
[im 18/35  brain]
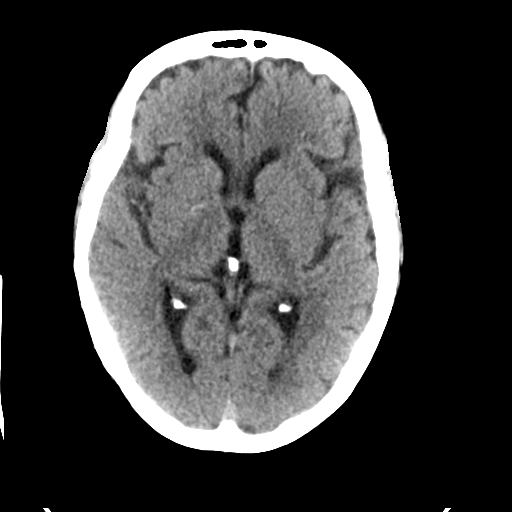
[im 22/35  brain]
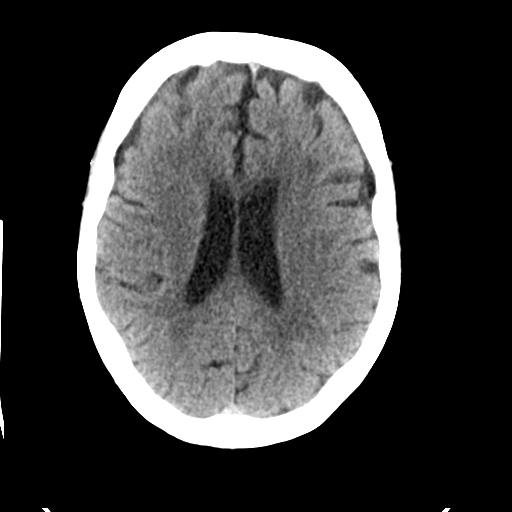
[im 22/35  bone]
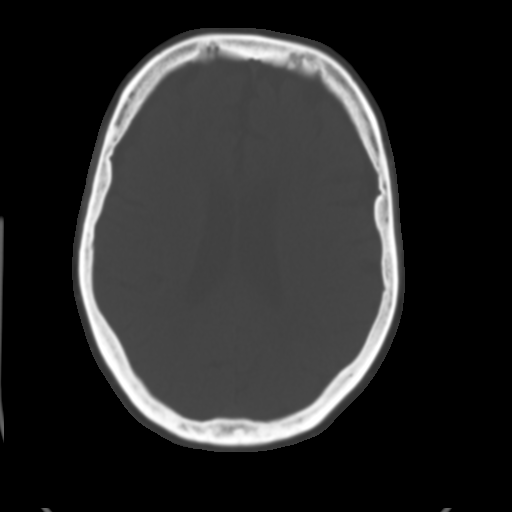
[im 26/35  brain]
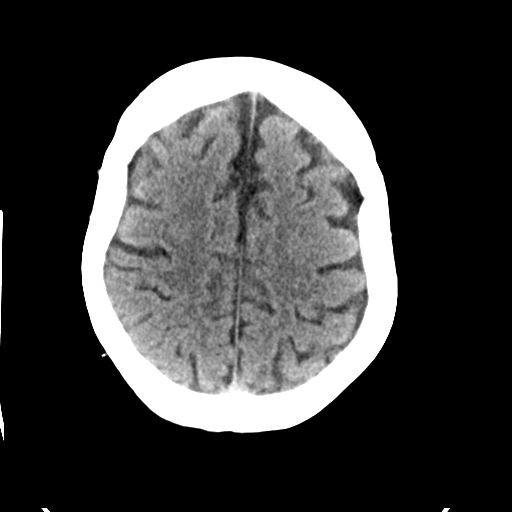
[im 30/35  brain]
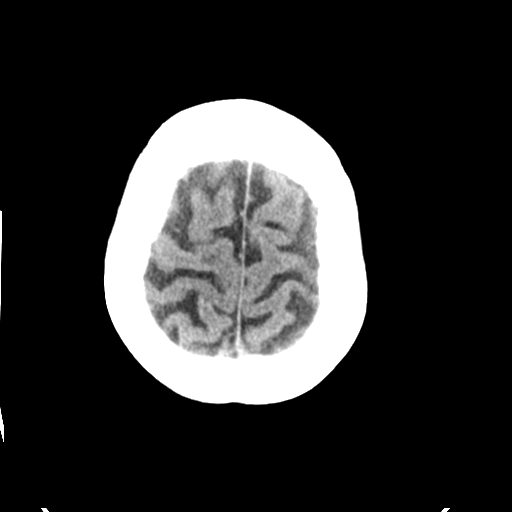

[Series 4: head bone · axial · 0.45mm/px · z∈[-131,-71]mm · 4 of 87 slices shown]
[im 9/87  bone]
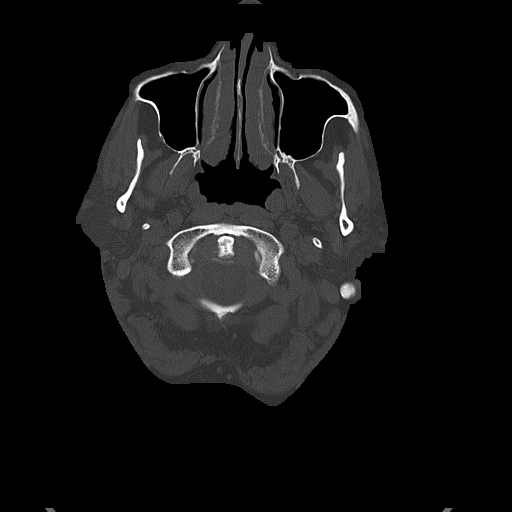
[im 18/87  bone]
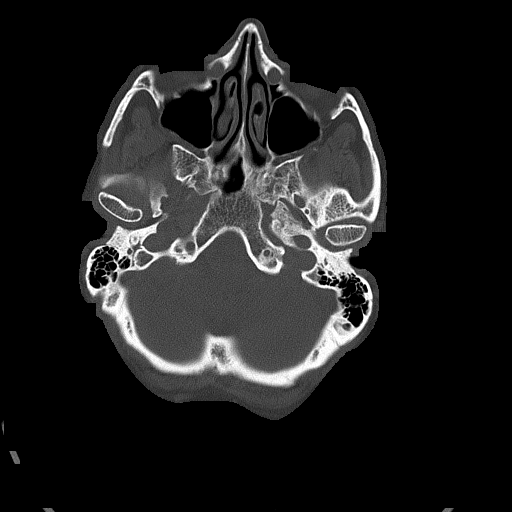
[im 26/87  bone]
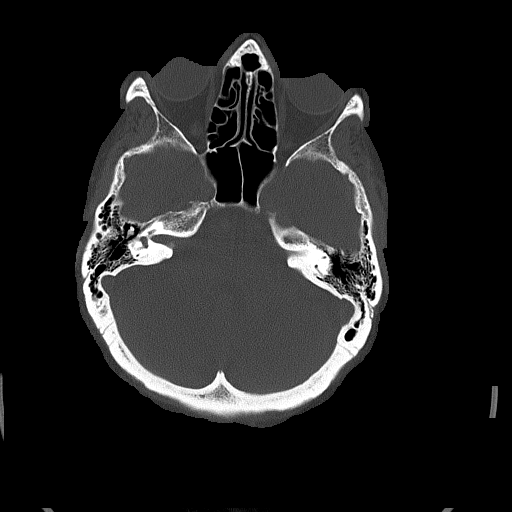
[im 39/87  bone]
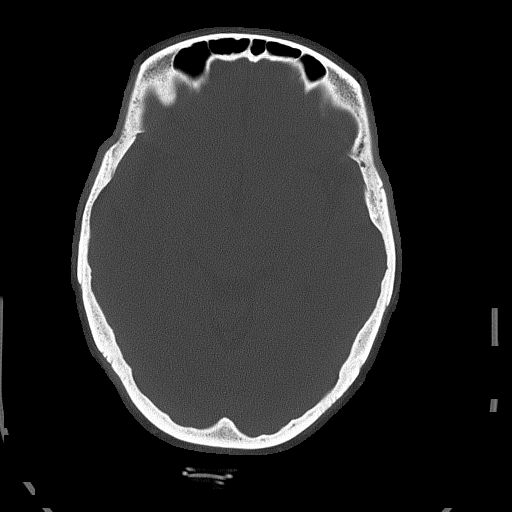

[Series 5: head without cor · coronal · non-contrast · 0.34mm/px · 3 of 74 slices shown]
[im 25/74  brain]
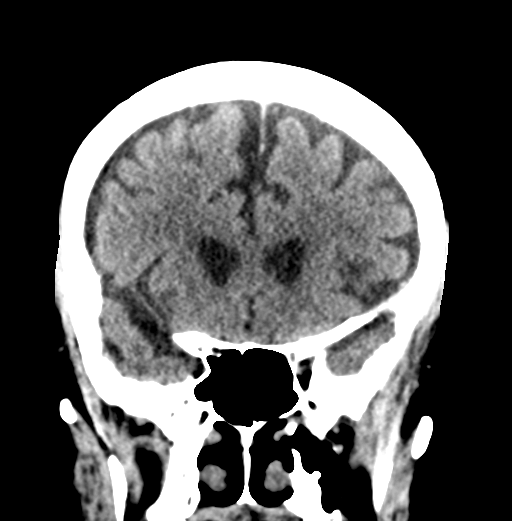
[im 33/74  brain]
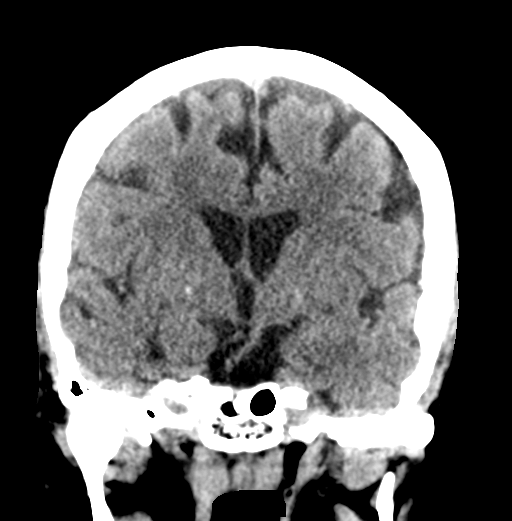
[im 41/74  brain]
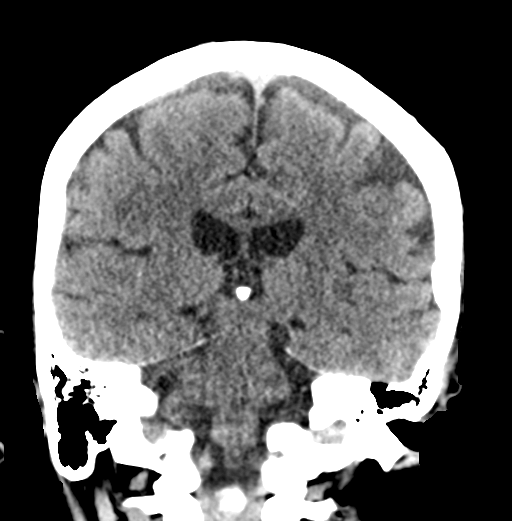

[Series 6: head without sag · sagittal · non-contrast · 0.36mm/px · 3 of 63 slices shown]
[im 21/63  brain]
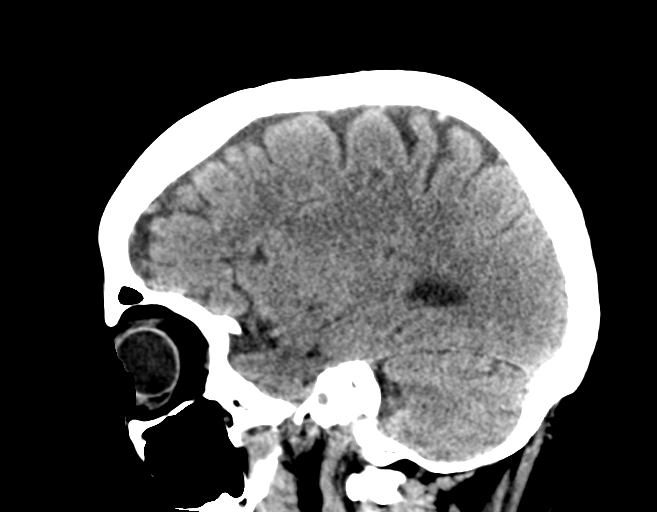
[im 32/63  brain]
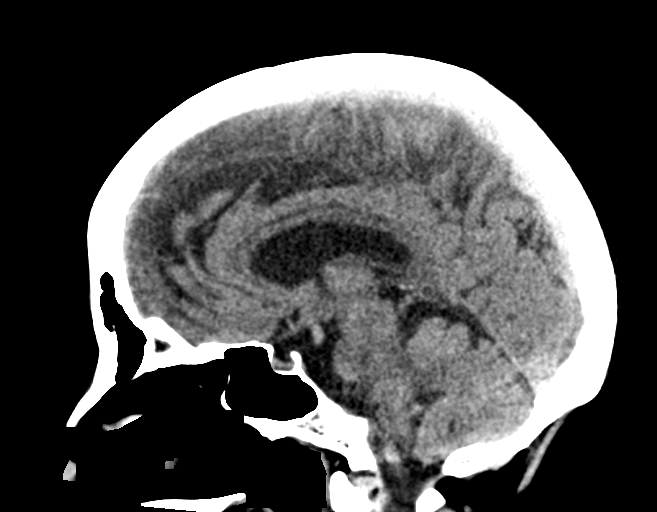
[im 42/63  brain]
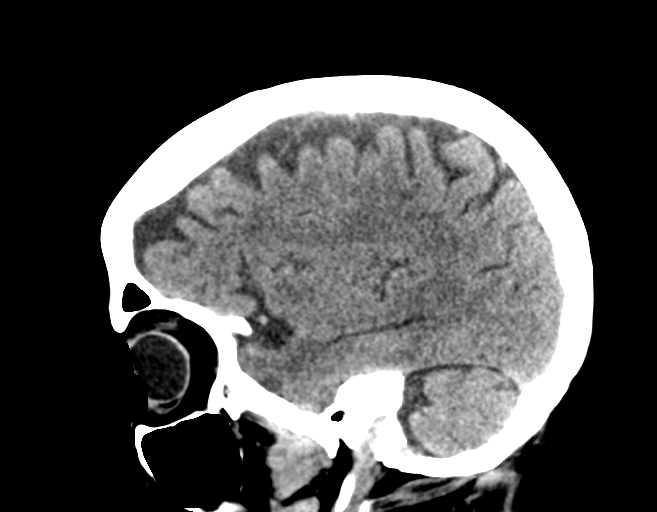

[17 of 47 positions shown; findings below may reference images not displayed]

FINDINGS: Brain: Right cerebellar infarct on MRI is not well seen by CT. No
evidence of acute ischemia. Stable generalized atrophy and mild
chronic small vessel ischemia. No hemorrhage, mass effect, or
midline shift.

Vascular: No hyperdense vessel or unexpected calcification. Mild
skull base atherosclerosis.

Skull: No fracture or focal lesion.

Sinuses/Orbits: Paranasal sinuses and mastoid air cells are clear.
The visualized orbits are unremarkable. Bilateral cataract
resection.

Other: None.
IMPRESSION: 1. Small right cerebellar acute infarct on MRI last month is not
seen by CT. No evidence of acute ischemia or other acute
abnormality.
2. Atrophy and chronic small vessel ischemia.

## 2018-12-09 ENCOUNTER — Ambulatory Visit (INDEPENDENT_AMBULATORY_CARE_PROVIDER_SITE_OTHER): Payer: Medicare Other | Admitting: *Deleted

## 2018-12-09 ENCOUNTER — Other Ambulatory Visit: Payer: Self-pay

## 2018-12-09 DIAGNOSIS — I6300 Cerebral infarction due to thrombosis of unspecified precerebral artery: Secondary | ICD-10-CM | POA: Diagnosis not present

## 2018-12-09 LAB — CUP PACEART REMOTE DEVICE CHECK
Date Time Interrogation Session: 20200415180801
Implantable Pulse Generator Implant Date: 20190319

## 2018-12-16 NOTE — Progress Notes (Signed)
[facility] Summary Report / Loop Recorder 

## 2019-01-07 DIAGNOSIS — R310 Gross hematuria: Secondary | ICD-10-CM | POA: Diagnosis not present

## 2019-01-11 ENCOUNTER — Ambulatory Visit (INDEPENDENT_AMBULATORY_CARE_PROVIDER_SITE_OTHER): Payer: Medicare Other | Admitting: *Deleted

## 2019-01-11 DIAGNOSIS — I6300 Cerebral infarction due to thrombosis of unspecified precerebral artery: Secondary | ICD-10-CM | POA: Diagnosis not present

## 2019-01-11 LAB — CUP PACEART REMOTE DEVICE CHECK
Date Time Interrogation Session: 20200518183656
Implantable Pulse Generator Implant Date: 20190319

## 2019-01-12 ENCOUNTER — Other Ambulatory Visit: Payer: Self-pay

## 2019-01-20 NOTE — Progress Notes (Signed)
[facility] Summary Report / Loop Recorder 

## 2019-01-21 ENCOUNTER — Ambulatory Visit: Payer: Medicare Other | Admitting: Adult Health

## 2019-02-15 ENCOUNTER — Ambulatory Visit (INDEPENDENT_AMBULATORY_CARE_PROVIDER_SITE_OTHER): Payer: Medicare Other | Admitting: *Deleted

## 2019-02-15 DIAGNOSIS — I6300 Cerebral infarction due to thrombosis of unspecified precerebral artery: Secondary | ICD-10-CM | POA: Diagnosis not present

## 2019-02-15 LAB — CUP PACEART REMOTE DEVICE CHECK
Date Time Interrogation Session: 20200620213928
Implantable Pulse Generator Implant Date: 20190319

## 2019-02-24 NOTE — Progress Notes (Signed)
[facility] Summary Report / Loop Recorder 

## 2019-03-18 ENCOUNTER — Ambulatory Visit (INDEPENDENT_AMBULATORY_CARE_PROVIDER_SITE_OTHER): Payer: Medicare Other | Admitting: *Deleted

## 2019-03-18 DIAGNOSIS — I6300 Cerebral infarction due to thrombosis of unspecified precerebral artery: Secondary | ICD-10-CM

## 2019-03-19 LAB — CUP PACEART REMOTE DEVICE CHECK
Date Time Interrogation Session: 20200724101047
Implantable Pulse Generator Implant Date: 20190319

## 2019-03-30 NOTE — Progress Notes (Signed)
[facility] Summary Report / Loop Recorder 

## 2019-04-20 ENCOUNTER — Ambulatory Visit (INDEPENDENT_AMBULATORY_CARE_PROVIDER_SITE_OTHER): Payer: Medicare Other | Admitting: *Deleted

## 2019-04-20 DIAGNOSIS — I6300 Cerebral infarction due to thrombosis of unspecified precerebral artery: Secondary | ICD-10-CM

## 2019-04-21 LAB — CUP PACEART REMOTE DEVICE CHECK
Date Time Interrogation Session: 20200826021012
Implantable Pulse Generator Implant Date: 20190319

## 2019-04-27 NOTE — Progress Notes (Signed)
[facility] Summary Report / Loop Recorder 

## 2019-04-29 ENCOUNTER — Other Ambulatory Visit: Payer: Self-pay

## 2019-04-29 ENCOUNTER — Ambulatory Visit (INDEPENDENT_AMBULATORY_CARE_PROVIDER_SITE_OTHER): Payer: Medicare Other | Admitting: Adult Health

## 2019-04-29 ENCOUNTER — Encounter: Payer: Self-pay | Admitting: Adult Health

## 2019-04-29 VITALS — BP 114/75 | HR 67 | Temp 96.2°F | Ht 68.0 in | Wt 190.0 lb

## 2019-04-29 DIAGNOSIS — I10 Essential (primary) hypertension: Secondary | ICD-10-CM

## 2019-04-29 DIAGNOSIS — E785 Hyperlipidemia, unspecified: Secondary | ICD-10-CM

## 2019-04-29 DIAGNOSIS — I63431 Cerebral infarction due to embolism of right posterior cerebral artery: Secondary | ICD-10-CM | POA: Diagnosis not present

## 2019-04-29 DIAGNOSIS — Z95818 Presence of other cardiac implants and grafts: Secondary | ICD-10-CM | POA: Diagnosis not present

## 2019-04-29 NOTE — Progress Notes (Signed)
I agree with the above plan 

## 2019-04-29 NOTE — Progress Notes (Signed)
[facility] [address] [place]. [place] [address] [phone]       OFFICE FOLLOW UP NOTE  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 Follow-up    Follow up for stroke room 9 patient alone pt is now taking pravastatin 40 mg she reduce the dosage herself she was taking 80mg  but had some muscle pain     HPI:  Stroke admission 11/09/2017: Cassandra Bailey an 73 y.o.femalepast medical history of hypertension, hyperlipidemia, anxiety and migraine presents to the hospital after having sudden onset episode of dizziness and gait imbalance at 8:15 in the morning while she was walking her dog. She stated that her symptoms occured suddenly, denied sensation of spinning, but just felt off balance. She leaned against her car on the left side and then had a fall resulting in pain in her hip. She walked back to her house and and laid in bed for 3 hours. Her son encouraged her to come to the hospital. She denies any ringing sensation in her ears, slurred speech, double vision. She felt completely back to herself. CT head showeddecreased attenuation in the left medial pons-midbrain. MRI brain reviewed and showed patchy small volume acute ischemic nonhemorrhagic infarcts involving the inferior right cerebellar hemisphere, right PICA territory. MRA head negative for intracranial stenosis. Carotid doppler unremarkable. 2D echo unremarkable for clot and normal EF. TEE negative for thrombus and PFO. Loop recorder placed on 11/11/17. LDL 129 and A1c 5.9. Recommended starting Pravachol 40mg  daily. Recommended DAPT of aspirin and plavix for 3 weeks and then Plavix alone. Patient discharged home in stable condition.   12/25/2017 visit: Since discharge, patient has been doing well. Continues to take plavix with mild rectal bleeding but  just noticed this this AM. She describes it was bright red blood and small amts. Advised her to keep an eye on this and notify if it worsens as this could be from fissure or hemorrhoids. Continues to take Pavachol without side effects of myalgias. Blood pressure satisfactory at 122/73. PCP has been adjusting antihypertensives. Concerned for infrequent episodes of facial flushing, feeling "clamy" and facial twitching. She has noticed these happen when she becomes stressed. Advised patient to speak with PCP regarding these. She does have some moments with increased dizziness/lightheadedness but this is subsiding. Pt has returned back to previous job and doing all previous activities.   Update 04/30/2018: Patient is being seen today for scheduled follow-up visit.  She states she is been doing well without residual deficits or symptoms.  States her PCP recently started her on Celebrex due to osteomyelitis and arthritis pain.  She states that even prior to her stroke, she has pain that starts up into her shoulders/tricep area and radiates down to her index finger on her right arm.  She describes it as more of a throbbing sensation and is experience increased pain with increased movement.  She states that Celebrex has helped some but not completely.  Patient continues to take Plavix without side effects of bleeding or bruising.  Continues to take pravastatin without side effects of myalgias.  Blood pressure today satisfactory 109/62.  Patient continues to stay active and maintains a healthy diet.  Loop recorder has not shown atrial fibrillation thus far.  Denies new or worsening stroke/TIA symptoms.  Update 04/29/2019: Cassandra Bailey is being seen today for  stroke follow-up.  She has been doing well since prior visit from a neurological standpoint.  She continues to work 3 days weekly and continues to maintain ADLs and IADLs independently.  She continues on Plavix without recent bleeding or bruising.  Continues on pravastatin  40 mg daily along with Zetia without myalgias.  She was previously on pravastatin 80 mg but due to myalgias, dosage decreased with resolution of myalgias.  Blood pressure today satisfactory 114/75.  She continues to follow with her PCP for HTN and HLD management and plans on having lab work obtained next month.  Loop recorder has not shown atrial fibrillation thus far.  Denies new or reoccurring stroke/TIA symptoms.     ROS:   14 system review of systems performed and negative with exception of no concerns  PMH:  Past Medical History:  Diagnosis Date  . Coronary artery disease   . Hypertension   . Stroke [facility])     PSH:  Past Surgical History:  Procedure Laterality Date  . LOOP RECORDER INSERTION N/A 11/11/2017   Procedure: LOOP RECORDER INSERTION;  Surgeon: Constance Haw, MD;  Location: [facility];  Service: Cardiovascular;  Laterality: N/A;  . TEE WITHOUT CARDIOVERSION N/A 11/11/2017   Procedure: TRANSESOPHAGEAL ECHOCARDIOGRAM (TEE) with loop;  Surgeon: Acie Fredrickson Wonda Cheng, MD;  Location: [facility] END[facility]COPY;  Service: Cardiovascular;  Laterality: N/A;    Social History:  Social History   Socioeconomic History  . Marital status: Divorced    Spouse name: Not on file  . Number of children: Not on file  . Years of education: Not on file  . Highest education level: Not on file  Occupational History  . Not on file  Social Needs  . Financial resource strain: Not on file  . Food insecurity    Worry: Not on file    Inability: Not on file  . Transportation needs    Medical: Not on file    Non-medical: Not on file  Tobacco Use  . Smoking status: Never Smoker  . Smokeless tobacco: Never Used  Substance and Sexual Activity  . Alcohol use: Yes    Comment: socially  . Drug use: No  . Sexual activity: Not on file  Lifestyle  . Physical activity    Days per week: Not on file    Minutes per session: Not on file  . Stress: Not on file  Relationships  . Social Product manager on phone: Not on file    Gets together: Not on file    Attends religious service: Not on file    Active member of club or organization: Not on file    Attends meetings of clubs or organizations: Not on file    Relationship status: Not on file  . Intimate partner violence    Fear of current or ex partner: Not on file    Emotionally abused: Not on file    Physically abused: Not on file    Forced sexual activity: Not on file  Other Topics Concern  . Not on file  Social History Narrative  . Not on file    Family History:  Family History  Problem Relation Age of Onset  . Heart disease Father     Medications:   Current Outpatient Medications on File Prior to Visit  Medication Sig Dispense Refill  . atenolol (TENORMIN) 50 MG tablet Take 50 mg by mouth daily.  3  . calcium acetate (PHOSLO) 667 MG capsule Take by  mouth 3 (three) times daily with meals.    . calcium carbonate 1250 MG capsule Take 1,000 mg by mouth 2 (two) times daily with a meal.     . celecoxib (CELEBREX) 200 MG capsule Take 200 mg by mouth 2 (two) times daily.    . cholecalciferol (VITAMIN D) 1000 units tablet Take 2,000 Units by mouth daily.    . clonazePAM (KLONOPIN) 0.5 MG tablet TK 1 T PO TID PRN  0  . clopidogrel (PLAVIX) 75 MG tablet Take 1 tablet (75 mg total) by mouth daily. 30 tablet 0  . ezetimibe (ZETIA) 10 MG tablet TK 1 T PO QD  3  . Flaxseed, Linseed, (FLAXSEED OIL PO) Take 1,000 mg by mouth 2 (two) times daily.    . nortriptyline (PAMELOR) 25 MG capsule Take 25 mg by mouth at bedtime.  3  . Omega-3 Fatty Acids (FISH OIL PO) Take 1,000 mg by mouth 2 (two) times daily.    . pravastatin (PRAVACHOL) 40 MG tablet Take 40 mg by mouth daily.  2  . telmisartan (MICARDIS) 80 MG tablet TK 1 T PO QD    . TELMISARTAN PO Take 75 mg by mouth.    . triamterene-hydrochlorothiazide (MAXZIDE) 75-50 MG tablet Take 1 tablet by mouth daily.  0  . vitamin E (VITAMIN E) 400 UNIT capsule Take 800 Units by mouth  daily.    Marland Kitchen zolpidem (AMBIEN) 10 MG tablet Take 10 mg by mouth at bedtime.   1   No current facility-administered medications on file prior to visit.     Allergies:   Allergies  Allergen Reactions  . Codeine Nausea And Vomiting     Physical Exam  Vitals:   04/29/19 1040  BP: 114/75  Pulse: 67  Temp: (!) 96.2 F (35.7 C)  Weight: 190 lb (86.2 kg)  Height: 5\' 8"  (1.727 m)   Body mass index is 28.89 kg/m. No exam data present  General: well developed, well nourished, pleasant elderly Caucasian female, seated, in no evident distress Head: head normocephalic and atraumatic.   Neck: supple with no carotid or supraclavicular bruits Cardiovascular: regular rate and rhythm, no murmurs Musculoskeletal: no deformity Skin:  no rash/petichiae Vascular:  Normal pulses all extremities  Neurologic Exam Mental Status: Awake and fully alert. Oriented to place and time. Recent and remote memory intact. Attention span, concentration and fund of knowledge appropriate. Mood and affect appropriate.  Cranial Nerves: Pupils equal, briskly reactive to light. Extraocular movements full without nystagmus. Visual fields full to confrontation. Hearing intact. Facial sensation intact. Face, tongue, palate moves normally and symmetrically.  Motor: Normal bulk and tone. Normal strength in all tested extremity muscles. Sensory.: intact to touch , pinprick , position and vibratory sensation.  Coordination: Rapid alternating movements normal in all extremities. Finger-to-nose and heel-to-shin performed accurately bilaterally. Gait and Station: Arises from chair without difficulty. Stance is normal. Gait demonstrates normal stride length and balance . Able to heel, toe and tandem walk without difficulty.  Reflexes: 1+ and symmetric. Toes downgoing.     Diagnostic Data (Labs, Imaging, Testing)  Dg Ribs Unilateral W/chest Left 11/09/2017 IMPRESSION:  1. No visualized rib fracture or pulmonary  complication.  2. Minimal subsegmental left basilar atelectasis.   Ct Head Wo Contrast 11/09/2017 IMPRESSION:  Age uncertain but potentially recent infarct in the medial left pons-midbrain junction. There is slight periventricular small vessel disease. Elsewhere gray-white compartments appear normal. No mass or hemorrhage. There are foci of arterial vascular calcification.   Mr  Mra Head Wo Contrast 11/10/2017 IMPRESSION:   MRI HEAD IMPRESSION: 1. Patchy small volume acute ischemic nonhemorrhagic infarcts involving the inferior right cerebellar hemisphere, right PICA territory.  2. Age-related cerebral atrophy with moderate chronic small vessel ischemic disease.   MRA HEAD IMPRESSION:  Negative intracranial MRA. No large vessel occlusion. No high-grade proximal or correctable stenosis.    Dg Hip Unilat With Pelvis 2-3 Views Left 11/09/2017 IMPRESSION:  1. No acute osseous abnormality about the left hip.  2. Moderate degenerative osteoarthrosis.   Transthoracic Echocardiogram -  Left ventricle: The cavity size was normal. There was mild concentric hypertrophy. Systolic function was vigorous. The estimated ejection fraction was in the range of 65% to 70%. Wall motion was normal; there were no regional wall motion abnormalities.  TEE 11/11/17 Study Conclusions - Left ventricle: The cavity size was normal. Wall thickness was   normal. Systolic function was normal. - Aortic valve: No evidence of vegetation. There was mild   regurgitation. - Mitral valve: No evidence of vegetation. - Left atrium: No evidence of thrombus in the atrial cavity or   appendage. - Atrial septum: No defect or patent foramen ovale was identified.      ASSESSMENT: Cassandra Bailey is a 73 y.o. 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 Vascular risk factors include HTN, HLD and CAD.  She has been stable from a stroke standpoint without residual deficits or  new/reoccurring stroke/TIA symptoms.    PLAN: -Continue clopidogrel 75 mg daily  and pravachol 40 mg daily, Zetia and fish oil for secondary stroke prevention -F/u with PCP regarding your HLD, HTN and CAD management  -continue to monitor loop recorder -continue to monitor BP at home -Maintain strict control of hypertension with blood pressure goal below 130/90, diabetes with hemoglobin A1c goal below 6.5% and cholesterol with LDL cholesterol (bad cholesterol) goal below 70 mg/dL. I also advised the patient to eat a healthy diet with plenty of whole grains, cereals, fruits and vegetables, exercise regularly and maintain ideal body weight.  Overall stable from a stroke standpoint with routine follow-ups by PCP therefore recommend follow-up as needed   Greater than 50% time during this 25 minute consultation visit was spent on counseling and coordination of care about HLD, HTN and CAD, discussion about risk benefit of anticoagulation and answering questions.     Frann Rider [facility]), AGNP-BC  [facility] [address] [place], [place] [address]  Phone [phone] Fax [phone]

## 2019-04-29 NOTE — Patient Instructions (Signed)
Continue clopidogrel 75 mg daily  and pravastatin 40 mg daily, Zetia 10 mg daily and fish oil for secondary stroke prevention  Continue to follow up with PCP regarding cholesterol and blood pressure management   Continue to stay active and maintain a healthy diet  Your loop recorder will continue to be monitored and you will be notified with any concerning findings  Continue to monitor blood pressure at home  Maintain strict control of hypertension with blood pressure goal below 130/90, diabetes with hemoglobin A1c goal below 6.5% and cholesterol with LDL cholesterol (bad cholesterol) goal below 70 mg/dL. I also advised the patient to eat a healthy diet with plenty of whole grains, cereals, fruits and vegetables, exercise regularly and maintain ideal body weight.        Thank you for coming to see [place] at [facility]. I hope we have been able to provide you high quality care today.  You may receive a patient satisfaction survey over the next few weeks. We would appreciate your feedback and comments so that we may continue to improve ourselves and the health of our patients.

## 2019-05-07 DIAGNOSIS — F419 Anxiety disorder, unspecified: Secondary | ICD-10-CM | POA: Diagnosis not present

## 2019-05-07 DIAGNOSIS — Z79899 Other long term (current) drug therapy: Secondary | ICD-10-CM | POA: Diagnosis not present

## 2019-05-07 DIAGNOSIS — Z5181 Encounter for therapeutic drug level monitoring: Secondary | ICD-10-CM | POA: Diagnosis not present

## 2019-05-07 DIAGNOSIS — Z7189 Other specified counseling: Secondary | ICD-10-CM | POA: Diagnosis not present

## 2019-05-17 DIAGNOSIS — M79674 Pain in right toe(s): Secondary | ICD-10-CM | POA: Diagnosis not present

## 2019-05-17 DIAGNOSIS — Z7189 Other specified counseling: Secondary | ICD-10-CM | POA: Diagnosis not present

## 2019-05-17 DIAGNOSIS — L03031 Cellulitis of right toe: Secondary | ICD-10-CM | POA: Diagnosis not present

## 2019-05-17 DIAGNOSIS — S99922A Unspecified injury of left foot, initial encounter: Secondary | ICD-10-CM | POA: Diagnosis not present

## 2019-05-17 DIAGNOSIS — M79672 Pain in left foot: Secondary | ICD-10-CM | POA: Diagnosis not present

## 2019-05-21 DIAGNOSIS — I10 Essential (primary) hypertension: Secondary | ICD-10-CM | POA: Diagnosis not present

## 2019-05-21 DIAGNOSIS — M79674 Pain in right toe(s): Secondary | ICD-10-CM | POA: Diagnosis not present

## 2019-05-21 DIAGNOSIS — L03031 Cellulitis of right toe: Secondary | ICD-10-CM | POA: Diagnosis not present

## 2019-05-24 ENCOUNTER — Ambulatory Visit (INDEPENDENT_AMBULATORY_CARE_PROVIDER_SITE_OTHER): Payer: Medicare Other | Admitting: *Deleted

## 2019-05-24 DIAGNOSIS — I6300 Cerebral infarction due to thrombosis of unspecified precerebral artery: Secondary | ICD-10-CM

## 2019-05-25 LAB — CUP PACEART REMOTE DEVICE CHECK
Date Time Interrogation Session: 20200929160144
Implantable Pulse Generator Implant Date: 20190319

## 2019-05-28 DIAGNOSIS — I10 Essential (primary) hypertension: Secondary | ICD-10-CM | POA: Diagnosis not present

## 2019-05-28 DIAGNOSIS — L03031 Cellulitis of right toe: Secondary | ICD-10-CM | POA: Diagnosis not present

## 2019-05-28 DIAGNOSIS — M79674 Pain in right toe(s): Secondary | ICD-10-CM | POA: Diagnosis not present

## 2019-06-02 NOTE — Progress Notes (Signed)
[facility] Summary Report / Loop Recorder 

## 2019-06-04 DIAGNOSIS — E782 Mixed hyperlipidemia: Secondary | ICD-10-CM | POA: Diagnosis not present

## 2019-06-04 DIAGNOSIS — Z0000 Encounter for general adult medical examination without abnormal findings: Secondary | ICD-10-CM | POA: Diagnosis not present

## 2019-06-04 DIAGNOSIS — Z7189 Other specified counseling: Secondary | ICD-10-CM | POA: Diagnosis not present

## 2019-06-04 DIAGNOSIS — I639 Cerebral infarction, unspecified: Secondary | ICD-10-CM | POA: Diagnosis not present

## 2019-06-04 DIAGNOSIS — I10 Essential (primary) hypertension: Secondary | ICD-10-CM | POA: Diagnosis not present

## 2019-06-10 DIAGNOSIS — F419 Anxiety disorder, unspecified: Secondary | ICD-10-CM | POA: Diagnosis not present

## 2019-06-10 DIAGNOSIS — M150 Primary generalized (osteo)arthritis: Secondary | ICD-10-CM | POA: Diagnosis not present

## 2019-06-10 DIAGNOSIS — I639 Cerebral infarction, unspecified: Secondary | ICD-10-CM | POA: Diagnosis not present

## 2019-06-10 DIAGNOSIS — E782 Mixed hyperlipidemia: Secondary | ICD-10-CM | POA: Diagnosis not present

## 2019-06-10 DIAGNOSIS — M79674 Pain in right toe(s): Secondary | ICD-10-CM | POA: Diagnosis not present

## 2019-06-10 DIAGNOSIS — I10 Essential (primary) hypertension: Secondary | ICD-10-CM | POA: Diagnosis not present

## 2019-06-10 DIAGNOSIS — Z79899 Other long term (current) drug therapy: Secondary | ICD-10-CM | POA: Diagnosis not present

## 2019-06-10 DIAGNOSIS — Z7189 Other specified counseling: Secondary | ICD-10-CM | POA: Diagnosis not present

## 2019-06-27 LAB — CUP PACEART REMOTE DEVICE CHECK
Date Time Interrogation Session: 20201101160119
Implantable Pulse Generator Implant Date: 20190319

## 2019-06-28 ENCOUNTER — Ambulatory Visit (INDEPENDENT_AMBULATORY_CARE_PROVIDER_SITE_OTHER): Payer: Medicare Other | Admitting: *Deleted

## 2019-06-28 DIAGNOSIS — I6300 Cerebral infarction due to thrombosis of unspecified precerebral artery: Secondary | ICD-10-CM

## 2019-07-21 NOTE — Progress Notes (Signed)
[facility] Summary Report / Loop Recorder 

## 2019-07-30 ENCOUNTER — Ambulatory Visit (INDEPENDENT_AMBULATORY_CARE_PROVIDER_SITE_OTHER): Payer: Medicare Other | Admitting: *Deleted

## 2019-07-30 DIAGNOSIS — Z1231 Encounter for screening mammogram for malignant neoplasm of breast: Secondary | ICD-10-CM | POA: Diagnosis not present

## 2019-07-30 DIAGNOSIS — Z124 Encounter for screening for malignant neoplasm of cervix: Secondary | ICD-10-CM | POA: Diagnosis not present

## 2019-07-30 DIAGNOSIS — I6300 Cerebral infarction due to thrombosis of unspecified precerebral artery: Secondary | ICD-10-CM

## 2019-07-30 DIAGNOSIS — Z01419 Encounter for gynecological examination (general) (routine) without abnormal findings: Secondary | ICD-10-CM | POA: Diagnosis not present

## 2019-07-30 DIAGNOSIS — B372 Candidiasis of skin and nail: Secondary | ICD-10-CM | POA: Diagnosis not present

## 2019-07-31 LAB — CUP PACEART REMOTE DEVICE CHECK
Date Time Interrogation Session: 20201204110156
Implantable Pulse Generator Implant Date: 20190319

## 2019-09-01 ENCOUNTER — Ambulatory Visit (INDEPENDENT_AMBULATORY_CARE_PROVIDER_SITE_OTHER): Payer: Medicare Other | Admitting: *Deleted

## 2019-09-01 DIAGNOSIS — I6300 Cerebral infarction due to thrombosis of unspecified precerebral artery: Secondary | ICD-10-CM | POA: Diagnosis not present

## 2019-09-02 LAB — CUP PACEART REMOTE DEVICE CHECK
Date Time Interrogation Session: 20210106110337
Implantable Pulse Generator Implant Date: 20190319

## 2019-09-09 DIAGNOSIS — Z79899 Other long term (current) drug therapy: Secondary | ICD-10-CM | POA: Diagnosis not present

## 2019-09-09 DIAGNOSIS — I639 Cerebral infarction, unspecified: Secondary | ICD-10-CM | POA: Diagnosis not present

## 2019-09-16 DIAGNOSIS — E782 Mixed hyperlipidemia: Secondary | ICD-10-CM | POA: Diagnosis not present

## 2019-09-16 DIAGNOSIS — I10 Essential (primary) hypertension: Secondary | ICD-10-CM | POA: Diagnosis not present

## 2019-09-16 DIAGNOSIS — R87629 Unspecified abnormal cytological findings in specimens from vagina: Secondary | ICD-10-CM | POA: Diagnosis not present

## 2019-09-16 DIAGNOSIS — Z79899 Other long term (current) drug therapy: Secondary | ICD-10-CM | POA: Diagnosis not present

## 2019-09-16 DIAGNOSIS — F419 Anxiety disorder, unspecified: Secondary | ICD-10-CM | POA: Diagnosis not present

## 2019-09-16 DIAGNOSIS — B372 Candidiasis of skin and nail: Secondary | ICD-10-CM | POA: Diagnosis not present

## 2019-09-16 DIAGNOSIS — R682 Dry mouth, unspecified: Secondary | ICD-10-CM | POA: Diagnosis not present

## 2019-09-27 DIAGNOSIS — H1131 Conjunctival hemorrhage, right eye: Secondary | ICD-10-CM | POA: Diagnosis not present

## 2019-10-04 ENCOUNTER — Ambulatory Visit (INDEPENDENT_AMBULATORY_CARE_PROVIDER_SITE_OTHER): Payer: Medicare Other | Admitting: *Deleted

## 2019-10-04 DIAGNOSIS — I6300 Cerebral infarction due to thrombosis of unspecified precerebral artery: Secondary | ICD-10-CM

## 2019-10-04 LAB — CUP PACEART REMOTE DEVICE CHECK
Date Time Interrogation Session: 20210208000306
Implantable Pulse Generator Implant Date: 20190319

## 2019-10-05 NOTE — Progress Notes (Signed)
ILR Remote 

## 2019-10-22 DIAGNOSIS — Z20828 Contact with and (suspected) exposure to other viral communicable diseases: Secondary | ICD-10-CM | POA: Diagnosis not present

## 2019-11-04 ENCOUNTER — Ambulatory Visit (INDEPENDENT_AMBULATORY_CARE_PROVIDER_SITE_OTHER): Payer: Medicare Other | Admitting: *Deleted

## 2019-11-04 DIAGNOSIS — I6300 Cerebral infarction due to thrombosis of unspecified precerebral artery: Secondary | ICD-10-CM | POA: Diagnosis not present

## 2019-11-04 LAB — CUP PACEART REMOTE DEVICE CHECK
Date Time Interrogation Session: 20210311002812
Implantable Pulse Generator Implant Date: 20190319

## 2019-11-04 NOTE — Progress Notes (Signed)
ILR Remote 

## 2019-11-18 DIAGNOSIS — Z961 Presence of intraocular lens: Secondary | ICD-10-CM | POA: Diagnosis not present

## 2019-12-06 ENCOUNTER — Ambulatory Visit (INDEPENDENT_AMBULATORY_CARE_PROVIDER_SITE_OTHER): Payer: Medicare Other | Admitting: *Deleted

## 2019-12-06 DIAGNOSIS — I6300 Cerebral infarction due to thrombosis of unspecified precerebral artery: Secondary | ICD-10-CM | POA: Diagnosis not present

## 2019-12-06 LAB — CUP PACEART REMOTE DEVICE CHECK
Date Time Interrogation Session: 20210411033645
Implantable Pulse Generator Implant Date: 20190319

## 2019-12-07 NOTE — Progress Notes (Signed)
ILR Remote 

## 2020-01-06 LAB — CUP PACEART REMOTE DEVICE CHECK
Date Time Interrogation Session: 20210512033504
Implantable Pulse Generator Implant Date: 20190319

## 2020-01-10 ENCOUNTER — Ambulatory Visit (INDEPENDENT_AMBULATORY_CARE_PROVIDER_SITE_OTHER): Payer: Medicare Other | Admitting: *Deleted

## 2020-01-10 DIAGNOSIS — I6300 Cerebral infarction due to thrombosis of unspecified precerebral artery: Secondary | ICD-10-CM | POA: Diagnosis not present

## 2020-01-11 NOTE — Progress Notes (Signed)
[facility] Summary Report / Loop Recorder 

## 2020-01-13 DIAGNOSIS — Z79899 Other long term (current) drug therapy: Secondary | ICD-10-CM | POA: Diagnosis not present

## 2020-01-13 DIAGNOSIS — F419 Anxiety disorder, unspecified: Secondary | ICD-10-CM | POA: Diagnosis not present

## 2020-01-13 DIAGNOSIS — I10 Essential (primary) hypertension: Secondary | ICD-10-CM | POA: Diagnosis not present

## 2020-01-13 DIAGNOSIS — E782 Mixed hyperlipidemia: Secondary | ICD-10-CM | POA: Diagnosis not present

## 2020-01-20 DIAGNOSIS — Z79899 Other long term (current) drug therapy: Secondary | ICD-10-CM | POA: Diagnosis not present

## 2020-01-20 DIAGNOSIS — I639 Cerebral infarction, unspecified: Secondary | ICD-10-CM | POA: Diagnosis not present

## 2020-01-20 DIAGNOSIS — I10 Essential (primary) hypertension: Secondary | ICD-10-CM | POA: Diagnosis not present

## 2020-01-20 DIAGNOSIS — M150 Primary generalized (osteo)arthritis: Secondary | ICD-10-CM | POA: Diagnosis not present

## 2020-01-20 DIAGNOSIS — E782 Mixed hyperlipidemia: Secondary | ICD-10-CM | POA: Diagnosis not present

## 2020-01-20 DIAGNOSIS — N390 Urinary tract infection, site not specified: Secondary | ICD-10-CM | POA: Diagnosis not present

## 2020-02-14 ENCOUNTER — Ambulatory Visit (INDEPENDENT_AMBULATORY_CARE_PROVIDER_SITE_OTHER): Payer: Medicare Other | Admitting: *Deleted

## 2020-02-14 DIAGNOSIS — I6300 Cerebral infarction due to thrombosis of unspecified precerebral artery: Secondary | ICD-10-CM

## 2020-02-14 LAB — CUP PACEART REMOTE DEVICE CHECK
Date Time Interrogation Session: 20210621004812
Implantable Pulse Generator Implant Date: 20190319

## 2020-02-15 NOTE — Progress Notes (Signed)
[facility] Summary Report / Loop Recorder 

## 2020-03-20 ENCOUNTER — Ambulatory Visit (INDEPENDENT_AMBULATORY_CARE_PROVIDER_SITE_OTHER): Payer: Medicare Other | Admitting: *Deleted

## 2020-03-20 DIAGNOSIS — I6300 Cerebral infarction due to thrombosis of unspecified precerebral artery: Secondary | ICD-10-CM | POA: Diagnosis not present

## 2020-03-21 LAB — CUP PACEART REMOTE DEVICE CHECK
Date Time Interrogation Session: 20210725232943
Implantable Pulse Generator Implant Date: 20190319

## 2020-03-22 NOTE — Progress Notes (Signed)
[facility] Summary Report / Loop Recorder 

## 2020-04-21 LAB — CUP PACEART REMOTE DEVICE CHECK
Date Time Interrogation Session: 20210825234429
Implantable Pulse Generator Implant Date: 20190319

## 2020-04-24 ENCOUNTER — Ambulatory Visit (INDEPENDENT_AMBULATORY_CARE_PROVIDER_SITE_OTHER): Payer: Medicare Other | Admitting: *Deleted

## 2020-04-24 DIAGNOSIS — I6300 Cerebral infarction due to thrombosis of unspecified precerebral artery: Secondary | ICD-10-CM | POA: Diagnosis not present

## 2020-04-26 NOTE — Progress Notes (Signed)
[facility] Summary Report / Loop Recorder 

## 2020-05-27 LAB — CUP PACEART REMOTE DEVICE CHECK
Date Time Interrogation Session: 20210925234405
Implantable Pulse Generator Implant Date: 20190319

## 2020-05-29 ENCOUNTER — Ambulatory Visit (INDEPENDENT_AMBULATORY_CARE_PROVIDER_SITE_OTHER): Payer: Medicare Other

## 2020-05-29 DIAGNOSIS — I6300 Cerebral infarction due to thrombosis of unspecified precerebral artery: Secondary | ICD-10-CM | POA: Diagnosis not present

## 2020-05-30 NOTE — Progress Notes (Signed)
[facility] Summary Report / Loop Recorder 

## 2020-06-05 DIAGNOSIS — I10 Essential (primary) hypertension: Secondary | ICD-10-CM | POA: Diagnosis not present

## 2020-06-05 DIAGNOSIS — Z0000 Encounter for general adult medical examination without abnormal findings: Secondary | ICD-10-CM | POA: Diagnosis not present

## 2020-06-05 DIAGNOSIS — I639 Cerebral infarction, unspecified: Secondary | ICD-10-CM | POA: Diagnosis not present

## 2020-06-05 DIAGNOSIS — E782 Mixed hyperlipidemia: Secondary | ICD-10-CM | POA: Diagnosis not present

## 2020-06-05 DIAGNOSIS — Z79899 Other long term (current) drug therapy: Secondary | ICD-10-CM | POA: Diagnosis not present

## 2020-06-08 DIAGNOSIS — Z23 Encounter for immunization: Secondary | ICD-10-CM | POA: Diagnosis not present

## 2020-06-15 DIAGNOSIS — M150 Primary generalized (osteo)arthritis: Secondary | ICD-10-CM | POA: Diagnosis not present

## 2020-06-15 DIAGNOSIS — F419 Anxiety disorder, unspecified: Secondary | ICD-10-CM | POA: Diagnosis not present

## 2020-06-15 DIAGNOSIS — I639 Cerebral infarction, unspecified: Secondary | ICD-10-CM | POA: Diagnosis not present

## 2020-06-15 DIAGNOSIS — I10 Essential (primary) hypertension: Secondary | ICD-10-CM | POA: Diagnosis not present

## 2020-06-15 DIAGNOSIS — Z79899 Other long term (current) drug therapy: Secondary | ICD-10-CM | POA: Diagnosis not present

## 2020-06-15 DIAGNOSIS — R600 Localized edema: Secondary | ICD-10-CM | POA: Diagnosis not present

## 2020-06-15 DIAGNOSIS — E782 Mixed hyperlipidemia: Secondary | ICD-10-CM | POA: Diagnosis not present

## 2020-06-21 ENCOUNTER — Ambulatory Visit (INDEPENDENT_AMBULATORY_CARE_PROVIDER_SITE_OTHER): Payer: Medicare Other

## 2020-06-21 DIAGNOSIS — I6300 Cerebral infarction due to thrombosis of unspecified precerebral artery: Secondary | ICD-10-CM | POA: Diagnosis not present

## 2020-06-21 LAB — CUP PACEART REMOTE DEVICE CHECK
Date Time Interrogation Session: 20211026234802
Implantable Pulse Generator Implant Date: 20190319

## 2020-06-22 DIAGNOSIS — R600 Localized edema: Secondary | ICD-10-CM | POA: Diagnosis not present

## 2020-06-23 NOTE — Progress Notes (Signed)
[facility] Summary Report / Loop Recorder 

## 2020-07-23 LAB — CUP PACEART REMOTE DEVICE CHECK
Date Time Interrogation Session: 20211126224906
Implantable Pulse Generator Implant Date: 20190319

## 2020-07-24 ENCOUNTER — Ambulatory Visit (INDEPENDENT_AMBULATORY_CARE_PROVIDER_SITE_OTHER): Payer: Medicare Other

## 2020-07-24 DIAGNOSIS — I6300 Cerebral infarction due to thrombosis of unspecified precerebral artery: Secondary | ICD-10-CM | POA: Diagnosis not present

## 2020-07-31 NOTE — Progress Notes (Signed)
[facility] Summary Report / Loop Recorder 

## 2020-08-03 DIAGNOSIS — Z20822 Contact with and (suspected) exposure to covid-19: Secondary | ICD-10-CM | POA: Diagnosis not present

## 2020-08-04 DIAGNOSIS — Z20822 Contact with and (suspected) exposure to covid-19: Secondary | ICD-10-CM | POA: Diagnosis not present

## 2020-08-23 LAB — CUP PACEART REMOTE DEVICE CHECK
Date Time Interrogation Session: 20211227225038
Implantable Pulse Generator Implant Date: 20190319

## 2020-08-28 ENCOUNTER — Ambulatory Visit (INDEPENDENT_AMBULATORY_CARE_PROVIDER_SITE_OTHER): Payer: Medicare Other

## 2020-08-28 DIAGNOSIS — I6300 Cerebral infarction due to thrombosis of unspecified precerebral artery: Secondary | ICD-10-CM | POA: Diagnosis not present

## 2020-09-12 NOTE — Progress Notes (Signed)
[facility] Summary Report / Loop Recorder 

## 2020-09-22 ENCOUNTER — Ambulatory Visit (INDEPENDENT_AMBULATORY_CARE_PROVIDER_SITE_OTHER): Payer: Medicare Other

## 2020-09-22 DIAGNOSIS — I6300 Cerebral infarction due to thrombosis of unspecified precerebral artery: Secondary | ICD-10-CM

## 2020-09-22 LAB — CUP PACEART REMOTE DEVICE CHECK
Date Time Interrogation Session: 20220127225540
Implantable Pulse Generator Implant Date: 20190319

## 2020-09-29 ENCOUNTER — Telehealth: Payer: Self-pay | Admitting: Cardiology

## 2020-09-29 NOTE — Telephone Encounter (Signed)
  1. Has your device fired? no  2. Is you device beeping? no  3. Are you experiencing draining or swelling at device site? no  4. Are you calling to see if we received your device transmission? no  5. Have you passed out? no   Patient states her battery life for her loop recorder is about 3 years. She would like to know if she needs to have it removed.    Please route to [place]

## 2020-09-29 NOTE — Telephone Encounter (Signed)
Patient informed that battery still good according to last transmission 09/21/20. Patient concerned because her PCP told her that after 3 years the device should be removed.Education done that loop recoredre can be left in place after battery depletion or explanted in the office. Patient made aware that battery may last up to 3.5 years ad when the transmission is received that shows battery at RRT then we will call her.

## 2020-09-30 NOTE — Progress Notes (Signed)
[facility] Summary Report / Loop Recorder 

## 2020-10-23 ENCOUNTER — Ambulatory Visit (INDEPENDENT_AMBULATORY_CARE_PROVIDER_SITE_OTHER): Payer: Medicare Other

## 2020-10-23 DIAGNOSIS — I6300 Cerebral infarction due to thrombosis of unspecified precerebral artery: Secondary | ICD-10-CM

## 2020-10-23 LAB — CUP PACEART REMOTE DEVICE CHECK
Date Time Interrogation Session: 20220227225619
Implantable Pulse Generator Implant Date: 20190319

## 2020-10-31 NOTE — Progress Notes (Signed)
[facility] Summary Report / Loop Recorder 

## 2020-11-23 ENCOUNTER — Ambulatory Visit (INDEPENDENT_AMBULATORY_CARE_PROVIDER_SITE_OTHER): Payer: Medicare Other

## 2020-11-23 ENCOUNTER — Other Ambulatory Visit: Payer: Self-pay

## 2020-11-23 DIAGNOSIS — I6300 Cerebral infarction due to thrombosis of unspecified precerebral artery: Secondary | ICD-10-CM | POA: Diagnosis not present

## 2020-11-23 LAB — CUP PACEART REMOTE DEVICE CHECK
Date Time Interrogation Session: 20220330235750
Implantable Pulse Generator Implant Date: 20190319

## 2020-12-06 NOTE — Progress Notes (Signed)
[facility] Summary Report / Loop Recorder 

## 2020-12-07 DIAGNOSIS — F419 Anxiety disorder, unspecified: Secondary | ICD-10-CM | POA: Diagnosis not present

## 2020-12-07 DIAGNOSIS — M150 Primary generalized (osteo)arthritis: Secondary | ICD-10-CM | POA: Diagnosis not present

## 2020-12-07 DIAGNOSIS — E782 Mixed hyperlipidemia: Secondary | ICD-10-CM | POA: Diagnosis not present

## 2020-12-07 DIAGNOSIS — I639 Cerebral infarction, unspecified: Secondary | ICD-10-CM | POA: Diagnosis not present

## 2020-12-07 DIAGNOSIS — I10 Essential (primary) hypertension: Secondary | ICD-10-CM | POA: Diagnosis not present

## 2020-12-07 DIAGNOSIS — Z79899 Other long term (current) drug therapy: Secondary | ICD-10-CM | POA: Diagnosis not present

## 2020-12-15 DIAGNOSIS — M150 Primary generalized (osteo)arthritis: Secondary | ICD-10-CM | POA: Diagnosis not present

## 2020-12-15 DIAGNOSIS — R600 Localized edema: Secondary | ICD-10-CM | POA: Diagnosis not present

## 2020-12-15 DIAGNOSIS — E782 Mixed hyperlipidemia: Secondary | ICD-10-CM | POA: Diagnosis not present

## 2020-12-15 DIAGNOSIS — F419 Anxiety disorder, unspecified: Secondary | ICD-10-CM | POA: Diagnosis not present

## 2020-12-15 DIAGNOSIS — Z79899 Other long term (current) drug therapy: Secondary | ICD-10-CM | POA: Diagnosis not present

## 2020-12-15 DIAGNOSIS — I10 Essential (primary) hypertension: Secondary | ICD-10-CM | POA: Diagnosis not present

## 2020-12-25 ENCOUNTER — Ambulatory Visit (INDEPENDENT_AMBULATORY_CARE_PROVIDER_SITE_OTHER): Payer: Medicare Other

## 2020-12-25 DIAGNOSIS — I6300 Cerebral infarction due to thrombosis of unspecified precerebral artery: Secondary | ICD-10-CM | POA: Diagnosis not present

## 2020-12-26 LAB — CUP PACEART REMOTE DEVICE CHECK
Date Time Interrogation Session: 20220501000220
Implantable Pulse Generator Implant Date: 20190319

## 2021-01-12 NOTE — Progress Notes (Signed)
[facility] Summary Report / Loop Recorder 

## 2021-01-24 LAB — CUP PACEART REMOTE DEVICE CHECK
Date Time Interrogation Session: 20220601002939
Implantable Pulse Generator Implant Date: 20190319

## 2021-01-25 ENCOUNTER — Ambulatory Visit (INDEPENDENT_AMBULATORY_CARE_PROVIDER_SITE_OTHER): Payer: Medicare Other

## 2021-01-25 DIAGNOSIS — I6300 Cerebral infarction due to thrombosis of unspecified precerebral artery: Secondary | ICD-10-CM

## 2021-02-09 DIAGNOSIS — M25551 Pain in right hip: Secondary | ICD-10-CM | POA: Diagnosis not present

## 2021-02-19 DIAGNOSIS — Z01818 Encounter for other preprocedural examination: Secondary | ICD-10-CM | POA: Diagnosis not present

## 2021-02-19 NOTE — Progress Notes (Signed)
[facility] Summary Report / Loop Recorder 

## 2021-02-22 DIAGNOSIS — M150 Primary generalized (osteo)arthritis: Secondary | ICD-10-CM | POA: Diagnosis not present

## 2021-02-22 DIAGNOSIS — Z01818 Encounter for other preprocedural examination: Secondary | ICD-10-CM | POA: Diagnosis not present

## 2021-02-22 DIAGNOSIS — E782 Mixed hyperlipidemia: Secondary | ICD-10-CM | POA: Diagnosis not present

## 2021-02-22 DIAGNOSIS — I10 Essential (primary) hypertension: Secondary | ICD-10-CM | POA: Diagnosis not present

## 2021-02-27 ENCOUNTER — Ambulatory Visit (INDEPENDENT_AMBULATORY_CARE_PROVIDER_SITE_OTHER): Payer: Medicare Other

## 2021-02-27 DIAGNOSIS — I6300 Cerebral infarction due to thrombosis of unspecified precerebral artery: Secondary | ICD-10-CM

## 2021-02-28 LAB — CUP PACEART REMOTE DEVICE CHECK
Date Time Interrogation Session: 20220702003239
Implantable Pulse Generator Implant Date: 20190319

## 2021-03-20 NOTE — Progress Notes (Signed)
[facility] Summary Report / Loop Recorder 

## 2021-03-23 DIAGNOSIS — M25551 Pain in right hip: Secondary | ICD-10-CM | POA: Diagnosis not present

## 2021-03-28 LAB — CUP PACEART REMOTE DEVICE CHECK
Date Time Interrogation Session: 20220802005156
Implantable Pulse Generator Implant Date: 20190319

## 2021-03-29 ENCOUNTER — Ambulatory Visit (INDEPENDENT_AMBULATORY_CARE_PROVIDER_SITE_OTHER): Payer: Medicare Other

## 2021-03-29 DIAGNOSIS — I6300 Cerebral infarction due to thrombosis of unspecified precerebral artery: Secondary | ICD-10-CM | POA: Diagnosis not present

## 2021-04-03 NOTE — Patient Instructions (Addendum)
DUE TO COVID-19 ONLY ONE VISITOR IS ALLOWED TO COME WITH YOU AND STAY IN THE WAITING ROOM ONLY DURING PRE OP AND PROCEDURE DAY OF SURGERY. THE 2 VISITORS  MAY VISIT WITH YOU AFTER SURGERY IN YOUR PRIVATE ROOM DURING VISITING HOURS ONLY!  YOU NEED TO HAVE A COVID 19 TEST ON_______ '@_______'$ , THIS TEST MUST BE DONE BEFORE SURGERY,   INSTRUCTIONS AS                 [name]     Your procedure is scheduled on: 04/18/21   Report to [facility] Main  Entrance   Report to admitting at  6:30 AM     Call this number if you have problems the morning of surgery Cassandra Bailey, NO CHEWING GUM [place].   No food after midnight.    You may have clear liquid until 6:00 AM.    At 5:30 AM drink pre surgery drink  . Nothing by mouth after 6:00 AM.    Take these medicines the morning of surgery with A SIP OF WATER: Atenolol. Stop your plavix on 04/12/21 and resume 1 day after surgery                                You may not have any metal on your body including hair pins and              piercings  Do not wear jewelry, make-up, lotions, powders or perfumes, deodorant             Do not wear nail polish on your fingernails.  Do not shave  48 hours prior to surgery.               Do not bring valuables to the hospital. [place].  Contacts, dentures or bridgework may not be worn into surgery.        Special Instructions: N/A              Please read over the following fact sheets you were given: _____________________________________________________________________             [facility] - Preparing for Surgery Before surgery, you can play an important role.  Because skin is not sterile, your skin needs to be as free of germs as possible.  You can reduce the number of germs on your skin by washing with CHG (chlorahexidine gluconate) soap before surgery.  CHG  is an antiseptic cleaner which kills germs and bonds with the skin to continue killing germs even after washing. Please DO NOT use if you have an allergy to CHG or antibacterial soaps.  If your skin becomes reddened/irritated stop using the CHG and inform your nurse when you arrive at Short Stay. Do not shave (including legs and underarms) for at least 48 hours prior to the first CHG shower.    Please follow these instructions carefully:  1.  Shower with CHG Soap the night before surgery and the  morning of Surgery.  2.  If you choose to wash your hair, wash your hair first as usual with your  normal  shampoo.  3.  After you shampoo, rinse your hair and body thoroughly to remove the  shampoo.  4.  Use CHG as you would any other liquid soap.  You can apply chg directly  to the skin and wash                       Gently with a scrungie or clean washcloth.  5.  Apply the CHG Soap to your body ONLY FROM THE NECK DOWN.   Do not use on face/ open                           Wound or open sores. Avoid contact with eyes, ears mouth and genitals (private parts).                       Wash face,  Genitals (private parts) with your normal soap.             6.  Wash thoroughly, paying special attention to the area where your surgery  will be performed.  7.  Thoroughly rinse your body with warm water from the neck down.  8.  DO NOT shower/wash with your normal soap after using and rinsing off  the CHG Soap.             9.  Pat yourself dry with a clean towel.            10.  Wear clean pajamas.            11.  Place clean sheets on your bed the night of your first shower and do not  sleep with pets. Day of Surgery : Do not apply any lotions/deodorants the morning of surgery.  Please wear clean clothes to the hospital/surgery center.  FAILURE TO FOLLOW THESE INSTRUCTIONS MAY RESULT IN THE CANCELLATION OF YOUR SURGERY PATIENT  SIGNATURE_________________________________  NURSE SIGNATURE__________________________________  ________________________________________________________________________   Cassandra Bailey  An incentive spirometer is a tool that can help keep your lungs clear and active. This tool measures how well you are filling your lungs with each breath. Taking long deep breaths may help reverse or decrease the chance of developing breathing (pulmonary) problems (especially infection) following: A long period of time when you are unable to move or be active. BEFORE THE PROCEDURE  If the spirometer includes an indicator to show your best effort, your nurse or respiratory therapist will set it to a desired goal. If possible, sit up straight or lean slightly forward. Try not to slouch. Hold the incentive spirometer in an upright position. INSTRUCTIONS FOR USE  Sit on the edge of your bed if possible, or sit up as far as you can in bed or on a chair. Hold the incentive spirometer in an upright position. Breathe out normally. Place the mouthpiece in your mouth and seal your lips tightly around it. Breathe in slowly and as deeply as possible, raising the piston or the ball toward the top of the column. Hold your breath for 3-5 seconds or for as long as possible. Allow the piston or ball to fall to the bottom of the column. Remove the mouthpiece from your mouth and breathe out normally. Rest for a few seconds and repeat Steps 1 through 7 at least 10 times every 1-2 hours when you are awake. Take your time and take a few normal breaths between deep breaths. The spirometer may include an indicator to show your best effort. Use the indicator as a goal to work toward during each repetition. After  each set of 10 deep breaths, practice coughing to be sure your lungs are clear. If you have an incision (the cut made at the time of surgery), support your incision when coughing by placing a pillow or rolled up towels  firmly against it. Once you are able to get out of bed, walk around indoors and cough well. You may stop using the incentive spirometer when instructed by your caregiver.  RISKS AND COMPLICATIONS Take your time so you do not get dizzy or light-headed. If you are in pain, you may need to take or ask for pain medication before doing incentive spirometry. It is harder to take a deep breath if you are having pain. AFTER USE Rest and breathe slowly and easily. It can be helpful to keep track of a log of your progress. Your caregiver can provide you with a simple table to help with this. If you are using the spirometer at home, follow these instructions: [place] IF:  You are having difficultly using the spirometer. You have trouble using the spirometer as often as instructed. Your pain medication is not giving enough relief while using the spirometer. You develop fever of 100.5 F (38.1 C) or higher. SEEK IMMEDIATE MEDICAL CARE IF:  You cough up bloody sputum that had not been present before. You develop fever of 102 F (38.9 C) or greater. You develop worsening pain at or near the incision site. MAKE SURE YOU:  Understand these instructions. Will watch your condition. Will get help right away if you are not doing well or get worse. Document Released: 12/23/2006 Document Revised: 11/04/2011 Document Reviewed: 02/23/2007 [facility] Patient Information 2014 [place], [place].   ________________________________________________________________________

## 2021-04-05 ENCOUNTER — Encounter (HOSPITAL_COMMUNITY)
Admission: RE | Admit: 2021-04-05 | Discharge: 2021-04-05 | Disposition: A | Discharge status: Home / Self Care | Payer: Medicare Other | Source: Ambulatory Visit | Attending: Orthopedic Surgery | Admitting: Orthopedic Surgery

## 2021-04-05 ENCOUNTER — Other Ambulatory Visit: Payer: Self-pay

## 2021-04-05 ENCOUNTER — Encounter (HOSPITAL_COMMUNITY): Payer: Self-pay

## 2021-04-05 DIAGNOSIS — Z01812 Encounter for preprocedural laboratory examination: Secondary | ICD-10-CM | POA: Insufficient documentation

## 2021-04-05 HISTORY — DX: Personal history of urinary calculi: Z87.442

## 2021-04-05 HISTORY — DX: Unspecified osteoarthritis, unspecified site: M19.90

## 2021-04-05 LAB — TYPE AND SCREEN
ABO/RH(D): A POS
Antibody Screen: NEGATIVE

## 2021-04-05 LAB — CBC
HCT: 39.5 % (ref 36.0–46.0)
Hemoglobin: 12.9 g/dL (ref 12.0–15.0)
MCH: 30.3 pg (ref 26.0–34.0)
MCHC: 32.7 g/dL (ref 30.0–36.0)
MCV: 92.7 fL (ref 80.0–100.0)
Platelets: 328 10*3/uL (ref 150–400)
RBC: 4.26 MIL/uL (ref 3.87–5.11)
RDW: 13.9 % (ref 11.5–15.5)
WBC: 4.8 10*3/uL (ref 4.0–10.5)
nRBC: 0 % (ref 0.0–0.2)

## 2021-04-05 LAB — COMPREHENSIVE METABOLIC PANEL
ALT: 13 U/L (ref 0–44)
AST: 18 U/L (ref 15–41)
Albumin: 4.3 g/dL (ref 3.5–5.0)
Alkaline Phosphatase: 64 U/L (ref 38–126)
Anion gap: 10 (ref 5–15)
BUN: 16 mg/dL (ref 8–23)
CO2: 30 mmol/L (ref 22–32)
Calcium: 9.9 mg/dL (ref 8.9–10.3)
Chloride: 101 mmol/L (ref 98–111)
Creatinine, Ser: 0.61 mg/dL (ref 0.44–1.00)
GFR, Estimated: >60 mL/min (ref >60)
Glucose, Bld: 115 mg/dL — ABNORMAL HIGH (ref 70–99)
Potassium: 3.8 mmol/L (ref 3.5–5.1)
Sodium: 141 mmol/L (ref 135–145)
Total Bilirubin: 0.6 mg/dL (ref 0.3–1.2)
Total Protein: 7.3 g/dL (ref 6.5–8.1)

## 2021-04-05 LAB — SURGICAL PCR SCREEN
MRSA, PCR: NEGATIVE
Staphylococcus aureus: NEGATIVE

## 2021-04-05 LAB — PROTIME-INR
INR: 0.8 (ref 0.8–1.2)
Prothrombin Time: 11.3 seconds — ABNORMAL LOW (ref 11.4–15.2)

## 2021-04-05 NOTE — Progress Notes (Signed)
COVID Vaccine Completed:Yes Date COVID Vaccine completed:10/26/19-booster 05/29/20 COVID vaccine manufacturer: Pfizer      PCP - Dr. Roselle Locus [place]abie 06/22/20 Cardiologist - Dr. Osvaldo Angst LOV 1 28/22  Chest x-ray - no EKG - 02/22/21-chart Stress Test - no ECHO - 11/10/17 -epic Cardiac Cath - no Pacemaker/ICD device last checked: Loop recorder last check 03/28/21  Sleep Study - no CPAP -   Fasting Blood Sugar - NA Checks Blood Sugar _____ times a day  Blood Thinner Instructions:Plavix/ Dr. Curt Bears Aspirin Instructions: Stop 5 days prior to DOS/ [place] Last Dose:04/12/21  Anesthesia review: yes  Patient denies shortness of breath, fever, cough and chest pain at PAT appointment Pt hasn't been doing much because of hip pain. She reports no SOB with activities.  Patient verbalized understanding of instructions that were given to them at the PAT appointment. Patient was also instructed that they will need to review over the PAT instructions again at home before surgery. yes

## 2021-04-09 NOTE — Anesthesia Preprocedure Evaluation (Addendum)
Anesthesia Evaluation  Patient identified by MRN, date of birth, ID band Patient awake    Reviewed: Allergy & Precautions, NPO status , Patient's Chart, lab work & pertinent test results  Airway Mallampati: II  TM Distance: >3 FB Neck ROM: Full    Dental  (+) Teeth Intact   Pulmonary neg pulmonary ROS,    Pulmonary exam normal        Cardiovascular hypertension, Pt. on medications and Pt. on home beta blockers + CAD   Rhythm:Regular Rate:Normal     Neuro/Psych CVA (2019) negative psych ROS   GI/Hepatic negative GI ROS, Neg liver ROS,   Endo/Other  negative endocrine ROS  Renal/GU negative Renal ROS  negative genitourinary   Musculoskeletal  (+) Arthritis , Osteoarthritis,    Abdominal (+)  Abdomen: soft.    Peds  Hematology negative hematology ROS (+)   Anesthesia Other Findings   Reproductive/Obstetrics                           Anesthesia Physical Anesthesia Plan  ASA: 3  Anesthesia Plan: MAC and Spinal   Post-op Pain Management:    Induction: Intravenous  PONV Risk Score and Plan: 2 and Ondansetron, Dexamethasone, Propofol infusion and Treatment may vary due to age or medical condition  Airway Management Planned: Simple Face Mask, Natural Airway and Nasal Cannula  Additional Equipment: None  Intra-op Plan:   Post-operative Plan:   Informed Consent: I have reviewed the patients History and Physical, chart, labs and discussed the procedure including the risks, benefits and alternatives for the proposed anesthesia with the patient or authorized representative who has indicated his/her understanding and acceptance.     Dental advisory given  Plan Discussed with: CRNA  Anesthesia Plan Comments: (See APP note by Durel Salts, FNP  Lab Results      Component                Value               Date                      WBC                      4.8                 04/05/2021                 HGB                      12.9                04/05/2021                HCT                      39.5                04/05/2021                MCV                      92.7                04/05/2021                PLT  328                 04/05/2021           Lab Results      Component                Value               Date                      NA                       141                 04/05/2021                K                        3.8                 04/05/2021                CO2                      30                  04/05/2021                GLUCOSE                  115 (H)             04/05/2021                BUN                      16                  04/05/2021                CREATININE               0.61                04/05/2021                CALCIUM                  9.9                 04/05/2021                GFRNONAA                 >60                 04/05/2021                GFRAA                    >60                 12/04/2017           ECHO 2019: Study Conclusions  - Left ventricle: The cavity size was normal. Wall thickness was  normal. Systolic function was normal.  - Aortic valve: No evidence of vegetation. There was mild  regurgitation.  - Mitral valve: No evidence of vegetation.  -  Left atrium: No evidence of thrombus in the atrial cavity or  appendage.  - Atrial septum: No defect or patent foramen ovale was identified. )      Anesthesia Quick Evaluation

## 2021-04-09 NOTE — Progress Notes (Signed)
Anesthesia Chart Review:   Case: [id number] Date/Time: 04/18/21 0845   Procedure: TOTAL HIP ARTHROPLASTY ANTERIOR APPROACH (Right: Hip)   Anesthesia type: Choice   Pre-op diagnosis: right hip osteoarthritis   Location: [facility] ROOM 09 / [facility]   Surgeons: Gaynelle Arabian, MD       DISCUSSION: Pt is 75 years old with hx stroke (2019), loop recorder, HTN  Pt to hold plavix 5 days before surgery  VS: BP 110/63   Pulse 66   Temp 36.9 C (Oral)   Resp 20   Ht 5' 6.5" (1.689 m)   Wt 78.5 kg   SpO2 99%   BMI 27.50 kg/m   PROVIDERS: - PCP is Merrilee Seashore, MD. Cleared for surgery at low risk by Deon Pilling, NP (on paper chart) - EP cardiologist Will Curt Bears, MD monitors loop recorder   LABS: Labs reviewed: Acceptable for surgery. (all labs ordered are listed, but only abnormal results are displayed)  Labs Reviewed  COMPREHENSIVE METABOLIC PANEL - Abnormal; Notable for the following components:      Result Value   Glucose, Bld 115 (*)    All other components within normal limits  PROTIME-INR - Abnormal; Notable for the following components:   Prothrombin Time 11.3 (*)    All other components within normal limits  SURGICAL PCR SCREEN  CBC  TYPE AND SCREEN    EKG 02/22/21 (PCP's office): Sinus rhythm Low voltage precordial leads Poor R wave progression Negative precordial T waves   CV: TEE 11/11/17:  - Left ventricle: The cavity size was normal. Wall thickness was normal. Systolic function was normal.  - Aortic valve: No evidence of vegetation. There was mild regurgitation.  - Mitral valve: No evidence of vegetation.  - Left atrium: No evidence of thrombus in the atrial cavity or appendage.  - Atrial septum: No defect or patent foramen ovale was identified.  Echo 11/10/17 - Left ventricle: The cavity size was normal. There was mild concentric hypertrophy. Systolic function was vigorous. The estimated ejection fraction was in the range of 65% to 70%. Wall motion was  normal; there were no regional wall motion abnormalities. Doppler parameters are consistent with abnormal left ventricular relaxation (grade 1 diastolic dysfunction). There was no evidence of elevated ventricular filling pressure by Doppler parameters.  - Aortic valve: There was no regurgitation.  - Mitral valve: Valve area by pressure half-time: 1.69 cm^2.  - Right ventricle: The cavity size was normal. Wall thickness was normal. Systolic function was normal.  - Right atrium: The atrium was normal in size.  - Tricuspid valve: There was trivial regurgitation.  - Pulmonary arteries: Systolic pressure was within the normal range.  - Inferior vena cava: The vessel was normal in size.  - Pericardium, extracardiac: There was no pericardial effusion.  - Impressions: No cardiac source of emboli was indentified.   Carotid duplex 11/10/17:  - Left Carotid: Velocities in the left ICA are consistent with a 1-39% stenosis.  - Vertebrals: Bilateral vertebral arteries demonstrate antegrade flow.    Past Medical History:  Diagnosis Date   Arthritis    hips, knee, back   Coronary artery disease    History of kidney stones    Hypertension    Stroke ([place]) 11/09/2017    Past Surgical History:  Procedure Laterality Date   LOOP RECORDER INSERTION N/A 11/11/2017   Procedure: LOOP RECORDER INSERTION;  Surgeon: Constance Haw, MD;  Location: [place] [facility];  Service: Cardiovascular;  Laterality: N/A;   TEE  WITHOUT CARDIOVERSION N/A 11/11/2017   Procedure: TRANSESOPHAGEAL ECHOCARDIOGRAM (TEE) with loop;  Surgeon: Thayer Headings, MD;  Location: [facility]OS[facility];  Service: Cardiovascular;  Laterality: N/A;    MEDICATIONS:  acetaminophen (TYLENOL) 500 MG tablet   atenolol (TENORMIN) 50 MG tablet   calcium carbonate (OS-CAL) 600 MG TABS tablet   cholecalciferol (VITAMIN D) 1000 units tablet   clonazePAM (KLONOPIN) 0.5 MG tablet   clopidogrel (PLAVIX) 75 MG tablet   ezetimibe (ZETIA) 10 MG tablet    Flaxseed, Linseed, (FLAXSEED OIL PO)   magnesium oxide (MAG-OX) 400 MG tablet   nortriptyline (PAMELOR) 25 MG capsule   Omega-3 Fatty Acids (FISH OIL PO)   pravastatin (PRAVACHOL) 80 MG tablet   Propylene Glycol (SYSTANE BALANCE OP)   telmisartan (MICARDIS) 80 MG tablet   triamterene-hydrochlorothiazide (MAXZIDE) 75-50 MG tablet   vitamin E 180 MG (400 UNITS) capsule   zolpidem (AMBIEN) 10 MG tablet   No current facility-administered medications for this encounter.   Pt to hold plavix 5 days before surgery   If no changes, I anticipate pt can proceed with surgery as scheduled.   Willeen Cass, PhD, FNP-BC [facility] Stay [facility]/Anesthesiology Phone: [phone] 04/09/2021 12:19 PM

## 2021-04-16 ENCOUNTER — Other Ambulatory Visit: Payer: Self-pay | Admitting: Orthopedic Surgery

## 2021-04-16 LAB — SARS CORONAVIRUS 2 (TAT 6-24 HRS): SARS Coronavirus 2: NEGATIVE

## 2021-04-17 NOTE — H&P (Signed)
TOTAL HIP ADMISSION H&P  Patient is admitted for right total hip arthroplasty.  Subjective:  Chief Complaint: Right hip pain  HPI: Cassandra Bailey, 75 y.o. female, has a history of pain and functional disability in the right hip due to arthritis and patient has failed non-surgical conservative treatments for greater than 12 weeks to include NSAID's and/or analgesics and activity modification. Onset of symptoms was gradual, starting  several  years ago with gradually worsening course since that time. The patient noted no past surgery on the right hip. Patient currently rates pain in the right hip at 7 out of 10 with activity. Patient has worsening of pain with activity and weight bearing, pain that interfers with activities of daily living, and pain with passive range of motion. Patient has evidence of periarticular osteophytes and joint space narrowing by imaging studies. This condition presents safety issues increasing the risk of falls. There is no current active infection.  Patient Active Problem List   Diagnosis Date Noted   HLD (hyperlipidemia) 11/11/2017   HTN (hypertension) 11/11/2017   Hypokalemia 11/11/2017   Stroke ([place]) 11/10/2017    Past Medical History:  Diagnosis Date   Arthritis    hips, knee, back   Coronary artery disease    History of kidney stones    Hypertension    Stroke ([place]) 11/09/2017    Past Surgical History:  Procedure Laterality Date   LOOP RECORDER INSERTION N/A 11/11/2017   Procedure: LOOP RECORDER INSERTION;  Surgeon: Constance Haw, MD;  Location: [place] [facility];  Service: Cardiovascular;  Laterality: N/A;   TEE WITHOUT CARDIOVERSION N/A 11/11/2017   Procedure: TRANSESOPHAGEAL ECHOCARDIOGRAM (TEE) with loop;  Surgeon: Acie Fredrickson Wonda Cheng, MD;  Location: [facility] ENDOSCOPY;  Service: Cardiovascular;  Laterality: N/A;    Prior to Admission medications   Medication Sig Start Date End Date Taking? Authorizing Provider  acetaminophen (TYLENOL) 500 MG  tablet Take 1,000 mg by mouth daily as needed for moderate pain or mild pain.   Yes [provider]  atenolol (TENORMIN) 50 MG tablet Take 50 mg by mouth daily. 11/01/17  Yes [provider]  calcium carbonate (OS-CAL) 600 MG TABS tablet Take 600 mg by mouth 2 (two) times daily with a meal.   Yes [provider]  cholecalciferol (VITAMIN D) 1000 units tablet Take 1,000 Units by mouth 2 (two) times daily.   Yes [provider]  clonazePAM (KLONOPIN) 0.5 MG tablet Take 0.5 mg by mouth daily. 11/14/17  Yes [provider]  clopidogrel (PLAVIX) 75 MG tablet Take 1 tablet (75 mg total) by mouth daily. Patient taking differently: Take 75 mg by mouth daily with supper. 11/11/17  Yes Geradine Girt, DO  ezetimibe (ZETIA) 10 MG tablet Take 10 mg by mouth at bedtime. 04/13/18  Yes [provider]  Flaxseed, Linseed, (FLAXSEED OIL PO) Take 1,000 mg by mouth 2 (two) times daily.   Yes [provider]  magnesium oxide (MAG-OX) 400 MG tablet Take 400 mg by mouth daily.   Yes [provider]  nortriptyline (PAMELOR) 25 MG capsule Take 25 mg by mouth at bedtime. 10/31/17  Yes [provider]  Omega-3 Fatty Acids (FISH OIL PO) Take 1,000 mg by mouth 2 (two) times daily.   Yes [provider]  pravastatin (PRAVACHOL) 80 MG tablet Take 80 mg by mouth at bedtime. 11/30/17  Yes [provider]  Propylene Glycol (SYSTANE BALANCE OP) Place 1 drop into both eyes daily.   Yes [provider]  telmisartan (MICARDIS) 80 MG tablet Take 80 mg by mouth daily. 03/19/19  Yes [provider]  triamterene-hydrochlorothiazide (MAXZIDE) 75-50 MG tablet Take 1 tablet by mouth daily. 09/29/17  Yes [provider]  vitamin E 180 MG (400 UNITS) capsule Take 400 Units by mouth 2 (two) times daily.   Yes [provider]  zolpidem (AMBIEN) 10 MG tablet Take 10 mg by mouth at bedtime.  11/03/17  Yes [provider]    Allergies  Allergen Reactions   Codeine Nausea And Vomiting    Social History   Socioeconomic History   Marital status: Divorced    Spouse name: Not on file   Number of children: Not on file   Years of education: Not on file   Highest education level: Not on file  Occupational History   Not on file  Tobacco Use   Smoking status: Never   Smokeless tobacco: Never  Vaping Use   Vaping Use: Never used  Substance and Sexual Activity   Alcohol use: Not Currently   Drug use: No   Sexual activity: Not on file  Other Topics Concern   Not on file  Social History Narrative   Not on file   Social Determinants of Health   Financial Resource Strain: Not on file  Food Insecurity: Not on file  Transportation Needs: Not on file  Physical Activity: Not on file  Stress: Not on file  Social Connections: Not on file  Intimate Partner Violence: Not on file    Tobacco Use: Low Risk    Smoking Tobacco Use: Never   Smokeless Tobacco Use: Never   Social History   Substance and Sexual Activity  Alcohol Use Not Currently    Family History  Problem Relation Age of Onset   Heart disease Father     ROS: Constitutional: no fever, no chills, no night sweats, no significant weight loss Cardiovascular: no chest pain, no palpitations Respiratory: no cough, no shortness of breath, No COPD Gastrointestinal: no vomiting, no nausea Musculoskeletal: no swelling in Joints, Joint Pain Neurologic: no numbness, no tingling, no difficulty with balance    Objective:  Physical Exam: Well nourished and well developed.  General: Alert and oriented x3, cooperative and pleasant, no acute distress.  Head: normocephalic, atraumatic, neck supple.  Eyes: EOMI.  Respiratory: breath sounds clear in all fields, no wheezing, rales, or rhonchi. Cardiovascular: Regular rate and rhythm, no murmurs, gallops or rubs.  Abdomen: non-tender to palpation and soft, normoactive bowel  sounds. Musculoskeletal:   The patient has an antalgic gait pattern favoring the right side without the use of assistive devices.       Right Hip Exam:   Range of motion: Flexion to 110 degrees, internal rotation to 0 degrees, external rotation to 0 degrees, and abduction to 10 degrees without discomfort.   There is no tenderness over the greater trochanteric bursa.   There is no pain on provocative testing of the hip.       Left Hip Exam:   Range of motion: Normal without discomfort.   There is no tenderness over the greater trochanteric bursa.  Calves soft and nontender. Motor function intact in LE. Strength 5/5 LE bilaterally. Neuro: Distal pulses 2+. Sensation to light touch intact in LE.  Vital signs in last 24 hours:    Imaging Review Radiographs- AP pelvis, AP and lateral of the bilateral hips dated 02/14/2021 demonstrate bone-on-bone arthritis throughout the right hip with dysplasia and marginal osteophytes.  The left hip is significantly worse than the right.  Assessment/Plan:  End stage arthritis, right hip  The patient history, physical examination, clinical judgement of the provider and imaging studies are consistent with end stage degenerative joint disease of the right hip and total hip arthroplasty is deemed medically necessary. The treatment options including medical management, injection therapy, arthroscopy and arthroplasty were discussed at length. The risks and benefits of total hip arthroplasty were presented and reviewed. The risks due to aseptic loosening, infection, stiffness, dislocation/subluxation, thromboembolic complications and other imponderables were discussed. The patient acknowledged the explanation, agreed to proceed with the plan and consent was signed. Patient is being admitted for inpatient treatment for surgery, pain control, PT, OT, prophylactic antibiotics, VTE prophylaxis, progressive ambulation and ADLs and discharge planning.The patient is  planning to be discharged  home .   Patient's anticipated LOS is less than 2 midnights, meeting these requirements: - Lives within 1 hour of care - Has a competent adult at home to recover with post-op - NO history of  - Chronic pain requiring opioids  - Diabetes  - Coronary Artery Disease  - Heart failure  - Heart attack  - Stroke  - DVT/VTE  - Cardiac arrhythmia  - Respiratory Failure/COPD  - Renal failure  - Anemia  - Advanced Liver disease    Therapy Plans: HEP Disposition: Home with Children Planned DVT Prophylaxis: Plavix (Hx of stroke) DME Needed: Gilford Rile, 3-in-1 PCP: Merrilee Seashore, MD (clearance received) TXA: IV Allergies: Codeine (Vein swelling) Anesthesia Concerns: N/V  BMI: 27.6 Last HgbA1c: N/A  Pharmacy: [facility] on [address]   - Patient was instructed on what medications to stop prior to surgery. - Follow-up visit in 2 weeks with Dr. Wynelle Link - Begin physical therapy following surgery - Pre-operative lab work as pre-surgical testing - Prescriptions will be provided in hospital at time of discharge  Fenton Foy, [facility], PA-C Orthopedic Surgery [facility] [place]

## 2021-04-18 ENCOUNTER — Encounter (HOSPITAL_COMMUNITY): Payer: Self-pay | Admitting: Orthopedic Surgery

## 2021-04-18 ENCOUNTER — Ambulatory Visit (HOSPITAL_COMMUNITY): Payer: Medicare Other | Admitting: Emergency Medicine

## 2021-04-18 ENCOUNTER — Other Ambulatory Visit: Payer: Self-pay

## 2021-04-18 ENCOUNTER — Observation Stay (HOSPITAL_COMMUNITY): Payer: Medicare Other

## 2021-04-18 ENCOUNTER — Encounter (HOSPITAL_COMMUNITY)
Admission: RE | Disposition: A | Discharge status: Home / Self Care | Payer: Self-pay | Source: Ambulatory Visit | Attending: Orthopedic Surgery

## 2021-04-18 ENCOUNTER — Ambulatory Visit (HOSPITAL_COMMUNITY): Payer: Medicare Other

## 2021-04-18 ENCOUNTER — Observation Stay (HOSPITAL_COMMUNITY)
Admission: RE | Admit: 2021-04-18 | Discharge: 2021-04-20 | Disposition: A | Discharge status: Home / Self Care | Payer: Medicare Other | Source: Ambulatory Visit | Attending: Orthopedic Surgery | Admitting: Orthopedic Surgery

## 2021-04-18 ENCOUNTER — Ambulatory Visit (HOSPITAL_COMMUNITY): Payer: Medicare Other | Admitting: Certified Registered Nurse Anesthetist

## 2021-04-18 DIAGNOSIS — I2510 Atherosclerotic heart disease of native coronary artery without angina pectoris: Secondary | ICD-10-CM | POA: Insufficient documentation

## 2021-04-18 DIAGNOSIS — Z7901 Long term (current) use of anticoagulants: Secondary | ICD-10-CM | POA: Insufficient documentation

## 2021-04-18 DIAGNOSIS — M169 Osteoarthritis of hip, unspecified: Secondary | ICD-10-CM | POA: Diagnosis present

## 2021-04-18 DIAGNOSIS — I10 Essential (primary) hypertension: Secondary | ICD-10-CM | POA: Insufficient documentation

## 2021-04-18 DIAGNOSIS — E785 Hyperlipidemia, unspecified: Secondary | ICD-10-CM | POA: Diagnosis not present

## 2021-04-18 DIAGNOSIS — M1611 Unilateral primary osteoarthritis, right hip: Secondary | ICD-10-CM | POA: Diagnosis not present

## 2021-04-18 DIAGNOSIS — Z79899 Other long term (current) drug therapy: Secondary | ICD-10-CM | POA: Diagnosis not present

## 2021-04-18 DIAGNOSIS — Z96649 Presence of unspecified artificial hip joint: Secondary | ICD-10-CM

## 2021-04-18 DIAGNOSIS — Z96641 Presence of right artificial hip joint: Secondary | ICD-10-CM | POA: Diagnosis not present

## 2021-04-18 DIAGNOSIS — Z471 Aftercare following joint replacement surgery: Secondary | ICD-10-CM | POA: Diagnosis not present

## 2021-04-18 HISTORY — PX: TOTAL HIP ARTHROPLASTY: SHX124

## 2021-04-18 LAB — ABO/RH: ABO/RH(D): A POS

## 2021-04-18 SURGERY — ARTHROPLASTY, HIP, TOTAL, ANTERIOR APPROACH
Anesthesia: Monitor Anesthesia Care | Site: Hip | Laterality: Right

## 2021-04-18 MED ORDER — HYDROMORPHONE HCL 2 MG PO TABS
2.0000 mg | ORAL_TABLET | ORAL | Status: DC | PRN
  Administered 2021-04-18: 4 mg via ORAL
  Administered 2021-04-18: 2 mg via ORAL
  Administered 2021-04-19: 4 mg via ORAL
  Administered 2021-04-19: 2 mg via ORAL
  Administered 2021-04-19: 4 mg via ORAL
  Filled 2021-04-18: qty 2
  Filled 2021-04-18: qty 1
  Filled 2021-04-18: qty 2
  Filled 2021-04-18: qty 1
  Filled 2021-04-18: qty 2

## 2021-04-18 MED ORDER — DEXAMETHASONE SODIUM PHOSPHATE 10 MG/ML IJ SOLN
8.0000 mg | Freq: Once | INTRAMUSCULAR | Status: AC
Start: 2021-04-18 — End: 2021-04-18
  Administered 2021-04-18: 8 mg via INTRAVENOUS

## 2021-04-18 MED ORDER — CEFAZOLIN SODIUM-DEXTROSE 2-4 GM/100ML-% IV SOLN
2.0000 g | INTRAVENOUS | Status: AC
  Administered 2021-04-18: 2 g via INTRAVENOUS
  Filled 2021-04-18: qty 100

## 2021-04-18 MED ORDER — FENTANYL CITRATE (PF) 100 MCG/2ML IJ SOLN: 25.0000 ug | INTRAMUSCULAR | Status: DC | PRN

## 2021-04-18 MED ORDER — PRAVASTATIN SODIUM 20 MG PO TABS
80.0000 mg | ORAL_TABLET | Freq: Every day | ORAL | Status: DC
  Administered 2021-04-19: 80 mg via ORAL
  Filled 2021-04-18: qty 4

## 2021-04-18 MED ORDER — WATER FOR IRRIGATION, STERILE IR SOLN
Status: DC | PRN
  Administered 2021-04-18: 2000 mL

## 2021-04-18 MED ORDER — POVIDONE-IODINE 10 % EX SWAB
2.0000 | Freq: Once | CUTANEOUS | Status: AC
Start: 2021-04-18 — End: 2021-04-18
  Administered 2021-04-18: 2 via TOPICAL

## 2021-04-18 MED ORDER — ATENOLOL 50 MG PO TABS
50.0000 mg | ORAL_TABLET | Freq: Every day | ORAL | Status: DC
  Administered 2021-04-19 – 2021-04-20 (×2): 50 mg via ORAL
  Filled 2021-04-18 (×2): qty 1

## 2021-04-18 MED ORDER — ASPIRIN EC 325 MG PO TBEC
325.0000 mg | DELAYED_RELEASE_TABLET | Freq: Every day | ORAL | Status: DC
  Administered 2021-04-19 – 2021-04-20 (×2): 325 mg via ORAL
  Filled 2021-04-18 (×2): qty 1

## 2021-04-18 MED ORDER — METHOCARBAMOL 500 MG IVPB - SIMPLE MED
500.0000 mg | Freq: Four times a day (QID) | INTRAVENOUS | Status: DC | PRN
  Filled 2021-04-18: qty 50

## 2021-04-18 MED ORDER — CLONAZEPAM 0.5 MG PO TABS
0.5000 mg | ORAL_TABLET | Freq: Every day | ORAL | Status: DC | PRN
  Administered 2021-04-18: 0.5 mg via ORAL
  Filled 2021-04-18: qty 1

## 2021-04-18 MED ORDER — LACTATED RINGERS IV SOLN: INTRAVENOUS | Status: DC

## 2021-04-18 MED ORDER — ACETAMINOPHEN 10 MG/ML IV SOLN: 1000.0000 mg | Freq: Once | INTRAVENOUS | Status: DC | PRN

## 2021-04-18 MED ORDER — PROPOFOL 10 MG/ML IV BOLUS
INTRAVENOUS | Status: AC
  Filled 2021-04-18: qty 20

## 2021-04-18 MED ORDER — TRANEXAMIC ACID-NACL 1000-0.7 MG/100ML-% IV SOLN
1000.0000 mg | INTRAVENOUS | Status: AC
  Administered 2021-04-18: 1000 mg via INTRAVENOUS
  Filled 2021-04-18: qty 100

## 2021-04-18 MED ORDER — PROPOFOL 500 MG/50ML IV EMUL
INTRAVENOUS | Status: DC | PRN
  Administered 2021-04-18: 50 ug/kg/min via INTRAVENOUS

## 2021-04-18 MED ORDER — ACETAMINOPHEN 10 MG/ML IV SOLN
1000.0000 mg | Freq: Four times a day (QID) | INTRAVENOUS | Status: DC
  Administered 2021-04-18: 1000 mg via INTRAVENOUS
  Filled 2021-04-18 (×2): qty 100

## 2021-04-18 MED ORDER — METHOCARBAMOL 500 MG PO TABS
500.0000 mg | ORAL_TABLET | Freq: Four times a day (QID) | ORAL | Status: DC | PRN
  Administered 2021-04-18 – 2021-04-20 (×5): 500 mg via ORAL
  Filled 2021-04-18 (×5): qty 1

## 2021-04-18 MED ORDER — NORTRIPTYLINE HCL 25 MG PO CAPS
25.0000 mg | ORAL_CAPSULE | Freq: Every day | ORAL | Status: DC
  Administered 2021-04-19 (×2): 25 mg via ORAL
  Filled 2021-04-18 (×2): qty 1

## 2021-04-18 MED ORDER — TRIAMTERENE-HCTZ 75-50 MG PO TABS
1.0000 | ORAL_TABLET | Freq: Every day | ORAL | Status: DC
  Administered 2021-04-19 – 2021-04-20 (×2): 1 via ORAL
  Filled 2021-04-18 (×2): qty 1

## 2021-04-18 MED ORDER — BUPIVACAINE IN DEXTROSE 0.75-8.25 % IT SOLN
INTRATHECAL | Status: DC | PRN
  Administered 2021-04-18: 1.7 mL via INTRATHECAL

## 2021-04-18 MED ORDER — METOCLOPRAMIDE HCL 5 MG PO TABS: 5.0000 mg | ORAL_TABLET | Freq: Three times a day (TID) | ORAL | Status: DC | PRN

## 2021-04-18 MED ORDER — CHLORHEXIDINE GLUCONATE 0.12 % MT SOLN
15.0000 mL | Freq: Once | OROMUCOSAL | Status: AC
  Administered 2021-04-18: 15 mL via OROMUCOSAL

## 2021-04-18 MED ORDER — PROPOFOL 1000 MG/100ML IV EMUL
INTRAVENOUS | Status: AC
  Filled 2021-04-18: qty 100

## 2021-04-18 MED ORDER — CLOPIDOGREL BISULFATE 75 MG PO TABS
75.0000 mg | ORAL_TABLET | Freq: Every day | ORAL | Status: DC
  Administered 2021-04-19 – 2021-04-20 (×2): 75 mg via ORAL
  Filled 2021-04-18 (×2): qty 1

## 2021-04-18 MED ORDER — ORAL CARE MOUTH RINSE: 15.0000 mL | Freq: Once | OROMUCOSAL | Status: AC

## 2021-04-18 MED ORDER — SODIUM CHLORIDE 0.9 % IV SOLN
250.0000 mL | Freq: Once | INTRAVENOUS | Status: AC
  Administered 2021-04-18: 250 mL via INTRAVENOUS

## 2021-04-18 MED ORDER — PROMETHAZINE HCL 25 MG/ML IJ SOLN: 6.2500 mg | INTRAMUSCULAR | Status: DC | PRN

## 2021-04-18 MED ORDER — PHENOL 1.4 % MT LIQD: 1.0000 | OROMUCOSAL | Status: DC | PRN

## 2021-04-18 MED ORDER — CEFAZOLIN SODIUM-DEXTROSE 2-4 GM/100ML-% IV SOLN
2.0000 g | Freq: Four times a day (QID) | INTRAVENOUS | Status: AC
  Administered 2021-04-18 (×2): 2 g via INTRAVENOUS
  Filled 2021-04-18 (×2): qty 100

## 2021-04-18 MED ORDER — DEXAMETHASONE SODIUM PHOSPHATE 10 MG/ML IJ SOLN
10.0000 mg | Freq: Once | INTRAMUSCULAR | Status: AC
  Administered 2021-04-19: 10 mg via INTRAVENOUS
  Filled 2021-04-18: qty 1

## 2021-04-18 MED ORDER — POLYETHYLENE GLYCOL 3350 17 G PO PACK: 17.0000 g | PACK | Freq: Every day | ORAL | Status: DC | PRN

## 2021-04-18 MED ORDER — ONDANSETRON HCL 4 MG/2ML IJ SOLN
INTRAMUSCULAR | Status: AC
  Filled 2021-04-18: qty 2

## 2021-04-18 MED ORDER — SODIUM CHLORIDE 0.9 % IV SOLN: INTRAVENOUS | Status: DC

## 2021-04-18 MED ORDER — FENTANYL CITRATE (PF) 100 MCG/2ML IJ SOLN
INTRAMUSCULAR | Status: DC | PRN
  Administered 2021-04-18: 50 ug via INTRAVENOUS

## 2021-04-18 MED ORDER — DEXAMETHASONE SODIUM PHOSPHATE 10 MG/ML IJ SOLN
INTRAMUSCULAR | Status: AC
  Filled 2021-04-18: qty 1

## 2021-04-18 MED ORDER — BISACODYL 10 MG RE SUPP: 10.0000 mg | Freq: Every day | RECTAL | Status: DC | PRN

## 2021-04-18 MED ORDER — 0.9 % SODIUM CHLORIDE (POUR BTL) OPTIME
TOPICAL | Status: DC | PRN
  Administered 2021-04-18: 1000 mL

## 2021-04-18 MED ORDER — ZOLPIDEM TARTRATE 5 MG PO TABS
5.0000 mg | ORAL_TABLET | Freq: Every evening | ORAL | Status: DC | PRN
  Administered 2021-04-19 (×2): 5 mg via ORAL
  Filled 2021-04-18 (×2): qty 1

## 2021-04-18 MED ORDER — PHENYLEPHRINE HCL-NACL 20-0.9 MG/250ML-% IV SOLN
INTRAVENOUS | Status: DC | PRN
  Administered 2021-04-18: 40 ug/min via INTRAVENOUS

## 2021-04-18 MED ORDER — DOCUSATE SODIUM 100 MG PO CAPS
100.0000 mg | ORAL_CAPSULE | Freq: Two times a day (BID) | ORAL | Status: DC
  Administered 2021-04-18 – 2021-04-20 (×4): 100 mg via ORAL
  Filled 2021-04-18 (×4): qty 1

## 2021-04-18 MED ORDER — MORPHINE SULFATE (PF) 2 MG/ML IV SOLN
0.5000 mg | INTRAVENOUS | Status: DC | PRN
  Administered 2021-04-18 – 2021-04-19 (×2): 1 mg via INTRAVENOUS
  Filled 2021-04-18 (×2): qty 1

## 2021-04-18 MED ORDER — BUPIVACAINE HCL 0.25 % IJ SOLN
INTRAMUSCULAR | Status: DC | PRN
  Administered 2021-04-18: 30 mL

## 2021-04-18 MED ORDER — ONDANSETRON HCL 4 MG PO TABS: 4.0000 mg | ORAL_TABLET | Freq: Four times a day (QID) | ORAL | Status: DC | PRN

## 2021-04-18 MED ORDER — METOCLOPRAMIDE HCL 5 MG/ML IJ SOLN: 5.0000 mg | Freq: Three times a day (TID) | INTRAMUSCULAR | Status: DC | PRN

## 2021-04-18 MED ORDER — PHENYLEPHRINE 40 MCG/ML (10ML) SYRINGE FOR IV PUSH (FOR BLOOD PRESSURE SUPPORT)
PREFILLED_SYRINGE | INTRAVENOUS | Status: DC | PRN
  Administered 2021-04-18 (×2): 80 ug via INTRAVENOUS

## 2021-04-18 MED ORDER — IRBESARTAN 150 MG PO TABS
300.0000 mg | ORAL_TABLET | Freq: Every day | ORAL | Status: DC
  Filled 2021-04-18 (×2): qty 2

## 2021-04-18 MED ORDER — TRAMADOL HCL 50 MG PO TABS
50.0000 mg | ORAL_TABLET | Freq: Four times a day (QID) | ORAL | Status: DC | PRN
  Administered 2021-04-18 – 2021-04-19 (×3): 100 mg via ORAL
  Administered 2021-04-20: 50 mg via ORAL
  Filled 2021-04-18 (×3): qty 2
  Filled 2021-04-18: qty 1
  Filled 2021-04-18: qty 2

## 2021-04-18 MED ORDER — MENTHOL 3 MG MT LOZG: 1.0000 | LOZENGE | OROMUCOSAL | Status: DC | PRN

## 2021-04-18 MED ORDER — BUPIVACAINE HCL (PF) 0.25 % IJ SOLN
INTRAMUSCULAR | Status: AC
  Filled 2021-04-18: qty 30

## 2021-04-18 MED ORDER — PROPOFOL 10 MG/ML IV BOLUS
INTRAVENOUS | Status: DC | PRN
  Administered 2021-04-18: 50 mg via INTRAVENOUS
  Administered 2021-04-18: 20 mg via INTRAVENOUS

## 2021-04-18 MED ORDER — ONDANSETRON HCL 4 MG/2ML IJ SOLN
INTRAMUSCULAR | Status: DC | PRN
  Administered 2021-04-18: 4 mg via INTRAVENOUS

## 2021-04-18 MED ORDER — FENTANYL CITRATE (PF) 100 MCG/2ML IJ SOLN
INTRAMUSCULAR | Status: AC
  Filled 2021-04-18: qty 2

## 2021-04-18 MED ORDER — ACETAMINOPHEN 325 MG PO TABS: 325.0000 mg | ORAL_TABLET | Freq: Four times a day (QID) | ORAL | Status: DC | PRN

## 2021-04-18 MED ORDER — EZETIMIBE 10 MG PO TABS
10.0000 mg | ORAL_TABLET | Freq: Every day | ORAL | Status: DC
  Administered 2021-04-19: 10 mg via ORAL
  Filled 2021-04-18: qty 1

## 2021-04-18 MED ORDER — ONDANSETRON HCL 4 MG/2ML IJ SOLN: 4.0000 mg | Freq: Four times a day (QID) | INTRAMUSCULAR | Status: DC | PRN

## 2021-04-18 SURGICAL SUPPLY — 44 items
BAG COUNTER SPONGE SURGICOUNT (BAG) IMPLANT
BAG DECANTER FOR FLEXI CONT (MISCELLANEOUS) IMPLANT
BAG SPEC THK2 15X12 ZIP CLS (MISCELLANEOUS)
BAG SPNG CNTER NS LX DISP (BAG)
BAG ZIPLOCK 12X15 (MISCELLANEOUS) IMPLANT
BLADE SAG 18X100X1.27 (BLADE) ×2 IMPLANT
COVER PERINEAL POST (MISCELLANEOUS) ×2 IMPLANT
COVER SURGICAL LIGHT HANDLE (MISCELLANEOUS) ×2 IMPLANT
CUP ACET PINNACLE SECTR 50MM (Hips) IMPLANT
DECANTER SPIKE VIAL GLASS SM (MISCELLANEOUS) ×2 IMPLANT
DRAPE FOOT SWITCH (DRAPES) ×2 IMPLANT
DRAPE STERI IOBAN 125X83 (DRAPES) ×2 IMPLANT
DRAPE U-SHAPE 47X51 STRL (DRAPES) ×4 IMPLANT
DRSG AQUACEL AG ADV 3.5X10 (GAUZE/BANDAGES/DRESSINGS) ×2 IMPLANT
DURAPREP 26ML APPLICATOR (WOUND CARE) ×2 IMPLANT
ELECT REM PT RETURN 15FT ADLT (MISCELLANEOUS) ×2 IMPLANT
GLOVE SRG 8 PF TXTR STRL LF DI (GLOVE) ×1 IMPLANT
GLOVE SURG ENC MOIS LTX SZ6.5 (GLOVE) ×2 IMPLANT
GLOVE SURG ENC MOIS LTX SZ7 (GLOVE) ×2 IMPLANT
GLOVE SURG ENC MOIS LTX SZ8 (GLOVE) ×4 IMPLANT
GLOVE SURG UNDER POLY LF SZ7 (GLOVE) ×2 IMPLANT
GLOVE SURG UNDER POLY LF SZ8 (GLOVE) ×2
GLOVE SURG UNDER POLY LF SZ8.5 (GLOVE) IMPLANT
GOWN STRL REUS W/TWL LRG LVL3 (GOWN DISPOSABLE) ×4 IMPLANT
GOWN STRL REUS W/TWL XL LVL3 (GOWN DISPOSABLE) IMPLANT
HEAD FEM STD 32X+5 STRL (Hips) ×1 IMPLANT
HOLDER FOLEY CATH W/STRAP (MISCELLANEOUS) ×2 IMPLANT
KIT TURNOVER KIT A (KITS) ×2 IMPLANT
LINER MARATHON 32 50 (Hips) ×1 IMPLANT
MANIFOLD NEPTUNE II (INSTRUMENTS) ×2 IMPLANT
PACK ANTERIOR HIP CUSTOM (KITS) ×2 IMPLANT
PENCIL SMOKE EVACUATOR COATED (MISCELLANEOUS) ×2 IMPLANT
PINNACLE SECTOR CUP 50MM (Hips) ×2 IMPLANT
STEM FEMORAL SZ 6MM STD ACTIS (Stem) ×1 IMPLANT
STRIP CLOSURE SKIN 1/2X4 (GAUZE/BANDAGES/DRESSINGS) ×2 IMPLANT
SUT ETHIBOND NAB CT1 #1 30IN (SUTURE) ×2 IMPLANT
SUT MNCRL AB 4-0 PS2 18 (SUTURE) ×2 IMPLANT
SUT STRATAFIX 0 PDS 27 VIOLET (SUTURE) ×2
SUT VIC AB 2-0 CT1 27 (SUTURE) ×4
SUT VIC AB 2-0 CT1 TAPERPNT 27 (SUTURE) ×2 IMPLANT
SUTURE STRATFX 0 PDS 27 VIOLET (SUTURE) ×1 IMPLANT
SYR 50ML LL SCALE MARK (SYRINGE) IMPLANT
TRAY FOLEY MTR SLVR 16FR STAT (SET/KITS/TRAYS/PACK) ×2 IMPLANT
TUBE SUCTION HIGH CAP CLEAR NV (SUCTIONS) ×2 IMPLANT

## 2021-04-18 NOTE — Plan of Care (Signed)
Discussed with patient and family about plan of care for post-op day 0.   Will continue to monitor patient.    [place], Therapist, sports

## 2021-04-18 NOTE — Interval H&P Note (Signed)
History and Physical Interval Note:  04/18/2021 7:19 AM  Cassandra Bailey  has presented today for surgery, with the diagnosis of right hip osteoarthritis.  The various methods of treatment have been discussed with the patient and family. After consideration of risks, benefits and other options for treatment, the patient has consented to  Procedure(s): TOTAL HIP ARTHROPLASTY ANTERIOR APPROACH (Right) as a surgical intervention.  The patient's history has been reviewed, patient examined, no change in status, stable for surgery.  I have reviewed the patient's chart and labs.  Questions were answered to the patient's satisfaction.     Pilar Plate Tynlee Bayle

## 2021-04-18 NOTE — Anesthesia Procedure Notes (Addendum)
Spinal  Patient location during procedure: OR Start time: 04/18/2021 8:47 AM End time: 04/18/2021 8:52 AM Reason for block: surgical anesthesia Staffing Performed: resident/CRNA  Anesthesiologist: Darral Dash, DO Resident/CRNA: Montel Clock, CRNA Preanesthetic Checklist Completed: patient identified, IV checked, risks and benefits discussed, surgical consent, monitors and equipment checked, pre-op evaluation and timeout performed Spinal Block Patient position: sitting Prep: DuraPrep Patient monitoring: heart rate, cardiac monitor, continuous pulse ox and blood pressure Approach: midline Location: L3-4 Injection technique: single-shot Needle Needle type: Pencan  Needle gauge: 24 G Needle length: 10 cm Needle insertion depth: 7 cm Assessment Sensory level: T6 Events: CSF return

## 2021-04-18 NOTE — Op Note (Signed)
OPERATIVE REPORT- TOTAL HIP ARTHROPLASTY   PREOPERATIVE DIAGNOSIS: Osteoarthritis of the Right hip.   POSTOPERATIVE DIAGNOSIS: Osteoarthritis of the Right  hip.   PROCEDURE: Right total hip arthroplasty, anterior approach.   SURGEON: Gaynelle Arabian, MD   ASSISTANT: Fenton Foy, PA-C  ANESTHESIA:  Spinal  ESTIMATED BLOOD LOSS:-250 mL    DRAINS: Hemovac x1.   COMPLICATIONS: None   CONDITION: PACU - hemodynamically stable.   BRIEF CLINICAL NOTE: Cassandra Bailey is a 75 y.o. female who has advanced end-  stage arthritis of their Right  hip with progressively worsening pain and  dysfunction.The patient has failed nonoperative management and presents for  total hip arthroplasty.   PROCEDURE IN DETAIL: After successful administration of spinal  anesthetic, the traction boots for the [facility] bed were placed on both  feet and the patient was placed onto the [facility] bed, boots placed into the leg  holders. The Right hip was then isolated from the perineum with plastic  drapes and prepped and draped in the usual sterile fashion. ASIS and  greater trochanter were marked and a oblique incision was made, starting  at about 1 cm lateral and 2 cm distal to the ASIS and coursing towards  the anterior cortex of the femur. The skin was cut with a 10 blade  through subcutaneous tissue to the level of the fascia overlying the  tensor fascia lata muscle. The fascia was then incised in line with the  incision at the junction of the anterior third and posterior 2/3rd. The  muscle was teased off the fascia and then the interval between the TFL  and the rectus was developed. The Hohmann retractor was then placed at  the top of the femoral neck over the capsule. The vessels overlying the  capsule were cauterized and the fat on top of the capsule was removed.  A Hohmann retractor was then placed anterior underneath the rectus  femoris to give exposure to the entire anterior capsule. A T-shaped   capsulotomy was performed. The edges were tagged and the femoral head  was identified.       Osteophytes are removed off the superior acetabulum.  The femoral neck was then cut in situ with an oscillating saw. Traction  was then applied to the left lower extremity utilizing the [facility]  traction. The femoral head was then removed. Retractors were placed  around the acetabulum and then circumferential removal of the labrum was  performed. Osteophytes were also removed. Reaming starts at 45 mm to  medialize and  Increased in 2 mm increments to 49 mm. We reamed in  approximately 40 degrees of abduction, 20 degrees anteversion. A 50 mm  pinnacle acetabular shell was then impacted in anatomic position under  fluoroscopic guidance with excellent purchase. We did not need to place  any additional dome screws. A 32 mm neutral + 4 marathon liner was then  placed into the acetabular shell.       The femoral lift was then placed along the lateral aspect of the femur  just distal to the vastus ridge. The leg was  externally rotated and capsule  was stripped off the inferior aspect of the femoral neck down to the  level of the lesser trochanter, this was done with electrocautery. The femur was lifted after this was performed. The  leg was then placed in an extended and adducted position essentially delivering the femur. We also removed the capsule superiorly and the piriformis from the piriformis  fossa to gain excellent exposure of the  proximal femur. Rongeur was used to remove some cancellous bone to get  into the lateral portion of the proximal femur for placement of the  initial starter reamer. The starter broaches was placed  the starter broach  and was shown to go down the center of the canal. Broaching  with the Actis system was then performed starting at size 0  coursing  Up to size 6. A size 6 had excellent torsional and rotational  and axial stability. The trial standard offset neck was then  placed  with a 32 + 5 trial head. The hip was then reduced. We confirmed that  the stem was in the canal both on AP and lateral x-rays. It also has excellent sizing. The hip was reduced with outstanding stability through full extension and full external rotation.. AP pelvis was taken and the leg lengths were measured and found to be equal. Hip was then dislocated again and the femoral head and neck removed. The  femoral broach was removed. Size 6 Actis stem with a standard offset  neck was then impacted into the femur following native anteversion. Has  excellent purchase in the canal. Excellent torsional and rotational and  axial stability. It is confirmed to be in the canal on AP and lateral  fluoroscopic views. The 32 + 5 metal head was placed and the hip  reduced with outstanding stability. Again AP pelvis was taken and it  confirmed that the leg lengths were equal. The wound was then copiously  irrigated with saline solution and the capsule reattached and repaired  with Ethibond suture. 30 ml of .25% Bupivicaine was  injected into the capsule and into the edge of the tensor fascia lata as well as subcutaneous tissue. The fascia overlying the tensor fascia lata was then closed with a running #1 V-Loc. Subcu was closed with interrupted 2-0 Vicryl and subcuticular running 4-0 Monocryl. Incision was cleaned  and dried. Steri-Strips and a bulky sterile dressing applied. The patient was awakened and transported to  recovery in stable condition.        Please note that a surgical assistant was a medical necessity for this procedure to perform it in a safe and expeditious manner. Assistant was necessary to provide appropriate retraction of vital neurovascular structures and to prevent femoral fracture and allow for anatomic placement of the prosthesis.  Gaynelle Arabian, M.D.

## 2021-04-18 NOTE — Evaluation (Signed)
Physical Therapy Evaluation Patient Details Name: Cassandra Bailey MRN: [id number] DOB: 03/09/1946 Today's Date: 04/18/2021   History of Present Illness  Patient is 75 y.o. female s/p Rt THA anterior approach on 04/18/21 with PMH signifcant for OA, CAD, HTN, Stroke, loop recorder placed in 2019.    Clinical Impression  Cassandra Bailey is a 75 y.o. female POD 0 s/p Rt THA. Patient reports independence with mobility at baseline with recent use of RW due to pain. Patient is now limited by functional impairments (see PT problem list below) and requires min-mod assist for transfers and gait with RW. Patient was able to ambulate 10 feet with RW and min assist. Patient limited by anxiety and pain during session, both seemed to improve as pt progressed mobility. Patient instructed in exercise to facilitate circulation to manage edema and reduce risk of DVT. Patient will benefit from continued skilled PT interventions to address impairments and progress towards PLOF. Acute PT will follow to progress mobility and stair training in preparation for safe discharge home.     Follow Up Recommendations Follow surgeon's recommendation for DC plan and follow-up therapies;Home health PT    Equipment Recommendations  Rolling walker with 5" wheels;3in1 (PT)    Recommendations for Other Services       Precautions / Restrictions Precautions Precautions: Fall Restrictions Weight Bearing Restrictions: No Other Position/Activity Restrictions: WBAT      Mobility  Bed Mobility Overal bed mobility: Needs Assistance Bed Mobility: Supine to Sit     Supine to sit: Mod assist;HOB elevated     General bed mobility comments: cues for use of bed rail and assist to bring bil LE's off EOB and use of bed pad to pivot. rehab tech assisting to bring trunk fully up as well.    Transfers Overall transfer level: Needs assistance Equipment used: Rolling walker (2 wheeled) Transfers: Sit to/from Stand Sit to Stand: Mod  assist;From elevated surface         General transfer comment: cues for hand placement and Mod Assist to rise from EOB.  Ambulation/Gait Ambulation/Gait assistance: Min assist Gait Distance (Feet): 10 Feet Assistive device: Rolling walker (2 wheeled) Gait Pattern/deviations: Step-to pattern;Decreased stride length;Decreased weight shift to right Gait velocity: decr   General Gait Details: pt's trunk flexed initially but posture improved throughout. Assist neeed to advance walker and cues for step pattern. no LOB noted, pt with short cautious steps and anxious at start but appeared to improve as pt continued.  Stairs            Wheelchair Mobility    Modified Rankin (Stroke Patients Only)       Balance Overall balance assessment: Needs assistance Sitting-balance support: Feet supported Sitting balance-Leahy Scale: Fair     Standing balance support: During functional activity;Bilateral upper extremity supported Standing balance-Leahy Scale: Poor                               Pertinent Vitals/Pain Pain Assessment: Faces Faces Pain Scale: Hurts even more Pain Location: Rt hip Pain Descriptors / Indicators: Aching;Discomfort;Guarding;Moaning Pain Intervention(s): Limited activity within patient's tolerance;Monitored during session;Premedicated before session;Repositioned;Ice applied    Home Living Family/patient expects to be discharged to:: Private residence Living Arrangements: Alone Available Help at Discharge: Family Type of Home: House Home Access: Stairs to enter Entrance Stairs-Rails: Right Entrance Stairs-Number of Steps: 3 Home Layout: One level [place] - single point  Prior Function Level of Independence: Independent with assistive device(s)         Comments: has been using a walker that was borrowed     Hand Dominance   Dominant Hand: Right    Extremity/Trunk Assessment   Upper Extremity Assessment Upper  Extremity Assessment: Overall WFL for tasks assessed    Lower Extremity Assessment Lower Extremity Assessment: Generalized weakness    Cervical / Trunk Assessment Cervical / Trunk Assessment: Normal  Communication   Communication: No difficulties  Cognition Arousal/Alertness: Awake/alert Behavior During Therapy: WFL for tasks assessed/performed Overall Cognitive Status: Within Functional Limits for tasks assessed                                        General Comments      Exercises Total Joint Exercises Ankle Circles/Pumps: AROM;Both;20 reps   Assessment/Plan    PT Assessment Patient needs continued PT services  PT Problem List Decreased strength;Decreased range of motion;Decreased activity tolerance;Decreased balance;Decreased mobility;Decreased knowledge of use of DME;Decreased knowledge of precautions;Pain       PT Treatment Interventions DME instruction;Gait training;Stair training;Functional mobility training;Therapeutic activities;Therapeutic exercise;Balance training;Patient/family education    PT Goals (Current goals can be found in the Care Plan section)  Acute Rehab PT Goals Patient Stated Goal: stop hurting and not fall PT Goal Formulation: With patient Time For Goal Achievement: 04/25/21 Potential to Achieve Goals: Good    Frequency 7X/week   Barriers to discharge        Co-evaluation               AM-PAC PT "6 Clicks" Mobility  Outcome Measure Help needed turning from your back to your side while in a flat bed without using bedrails?: A Little Help needed moving from lying on your back to sitting on the side of a flat bed without using bedrails?: A Lot Help needed moving to and from a bed to a chair (including a wheelchair)?: A Lot Help needed standing up from a chair using your arms (e.g., wheelchair or bedside chair)?: A Lot Help needed to walk in hospital room?: A Little Help needed climbing 3-5 steps with a railing? : A  Lot 6 Click Score: 14    End of Session Equipment Utilized During Treatment: Gait belt Activity Tolerance: Patient tolerated treatment well;Patient limited by pain (limited by anxiety) Patient left: in chair;with call bell/phone within reach;with chair alarm set;with family/visitor present Nurse Communication: Mobility status;Patient requests pain meds PT Visit Diagnosis: Muscle weakness (generalized) (M62.81);Difficulty in walking, not elsewhere classified (R26.2);Pain Pain - Right/Left: Right Pain - part of body: Hip    Time: [id number] PT Time Calculation (min) (ACUTE ONLY): 29 min   Charges:   PT Evaluation $PT Eval Low Complexity: 1 Low PT Treatments $Gait Training: 8-22 mins        Verner Mould, DPT Acute Rehabilitation Services Office [phone] Pager [phone]   Jacques Navy 04/18/2021, 7:06 PM

## 2021-04-18 NOTE — Transfer of Care (Signed)
Immediate Anesthesia Transfer of Care Note  Patient: Cassandra Bailey  Procedure(s) Performed: TOTAL HIP ARTHROPLASTY ANTERIOR APPROACH (Right: Hip)  Patient Location: PACU  Anesthesia Type:Spinal  Level of Consciousness: drowsy and patient cooperative  Airway & Oxygen Therapy: Patient Spontanous Breathing and Patient connected to face mask oxygen  Post-op Assessment: Report given to RN and Post -op Vital signs reviewed and stable  Post vital signs: Reviewed and stable  Last Vitals:  Vitals Value Taken Time  BP 86/47 04/18/21 1030  Temp    Pulse 58 04/18/21 1032  Resp 13 04/18/21 1032  SpO2 98 % 04/18/21 1032  Vitals shown include unvalidated device data.  Last Pain:  Vitals:   04/18/21 0700  TempSrc:   PainSc: 7       Patients Stated Pain Goal: 4 ([id number] [phone])  Complications: No notable events documented.

## 2021-04-18 NOTE — Progress Notes (Signed)
Called back down to PACU because transportation RN did not call patients family to let them know patient had arrived to floor at 1245.   I called patients family at 1340 to inform them of patient arrival, and informed family that I would follow up about staff.    [place], Therapist, sports

## 2021-04-18 NOTE — Discharge Instructions (Signed)
Cassandra Arabian, MD Total Joint Specialist [facility] [address], [address] [place], [place] [address] [phone]  ANTERIOR APPROACH TOTAL HIP REPLACEMENT POSTOPERATIVE DIRECTIONS     Hip Rehabilitation, Guidelines Following Surgery  The results of a hip operation are greatly improved after range of motion and muscle strengthening exercises. Follow all safety measures which are given to protect your hip. If any of these exercises cause increased pain or swelling in your joint, decrease the amount until you are comfortable again. Then slowly increase the exercises. Call your caregiver if you have problems or questions.   BLOOD CLOT PREVENTION Take a 325 mg Aspirin once a day for three weeks following surgery with your usual Plavix dose. Then take an 81 mg aspirin once a day for three weeks with your usual Plavix dose. Then discontinue aspirin.  You may resume your vitamins/supplements upon discharge from the hospital.  [employer] items at home which could result in a fall. This includes throw rugs or furniture in walking pathways.  ICE to the affected hip as frequently as 20-30 minutes an hour and then as needed for pain and swelling. Continue to use ice on the hip for pain and swelling from surgery. You may notice swelling that will progress down to the foot and ankle. This is normal after surgery. Elevate the leg when you are not up walking on it.   Continue to use the breathing machine which will help keep your temperature down.  It is common for your temperature to cycle up and down following surgery, especially at night when you are not up moving around and exerting yourself.  The breathing machine keeps your lungs expanded and your temperature down.  DIET You may resume your previous home diet once your are discharged from the hospital.  DRESSING / WOUND CARE / SHOWERING You have an adhesive waterproof bandage over the incision. Leave this in  place until your first follow-up appointment. Once you remove this you will not need to place another bandage.  You may begin showering 3 days following surgery, but do not submerge the incision under water.  ACTIVITY For the first 3-5 days, it is important to rest and keep the operative leg elevated. You should, as a general rule, rest for 50 minutes and walk/stretch for 10 minutes per hour. After 5 days, you may slowly increase activity as tolerated.  Perform the exercises you were provided twice a day for about 15-20 minutes each session. Begin these 2 days following surgery. Walk with your walker as instructed. Use the walker until you are comfortable transitioning to a cane. Walk with the cane in the opposite hand of the operative leg. You may discontinue the cane once you are comfortable and walking steadily. Avoid periods of inactivity such as sitting longer than an hour when not asleep. This helps prevent blood clots.  Do not drive a car for 6 weeks or until released by your surgeon.  Do not drive while taking narcotics.  TED HOSE STOCKINGS Wear the elastic stockings on both legs for three weeks following surgery during the day. You may remove them at night while sleeping.  WEIGHT BEARING Weight bearing as tolerated with assist device (walker, cane, etc) as directed, use it as long as suggested by your surgeon or therapist, typically at least 4-6 weeks.  POSTOPERATIVE CONSTIPATION PROTOCOL Constipation - defined medically as fewer than three stools per week and severe constipation as less than one stool per week.  One  of the most common issues patients have following surgery is constipation.  Even if you have a regular bowel pattern at home, your normal regimen is likely to be disrupted due to multiple reasons following surgery.  Combination of anesthesia, postoperative narcotics, change in appetite and fluid intake all can affect your bowels.  In order to avoid complications following  surgery, here are some recommendations in order to help you during your recovery period.  Colace (docusate) - Pick up an over-the-counter form of Colace or another stool softener and take twice a day as long as you are requiring postoperative pain medications.  Take with a full glass of water daily.  If you experience loose stools or diarrhea, hold the colace until you stool forms back up.  If your symptoms do not get better within 1 week or if they get worse, check with your doctor. Dulcolax (bisacodyl) - Pick up over-the-counter and take as directed by the product packaging as needed to assist with the movement of your bowels.  Take with a full glass of water.  Use this product as needed if not relieved by Colace only.  MiraLax (polyethylene glycol) - Pick up over-the-counter to have on hand.  MiraLax is a solution that will increase the amount of water in your bowels to assist with bowel movements.  Take as directed and can mix with a glass of water, juice, soda, coffee, or tea.  Take if you go more than two days without a movement.Do not use MiraLax more than once per day. Call your doctor if you are still constipated or irregular after using this medication for 7 days in a row.  If you continue to have problems with postoperative constipation, please contact the office for further assistance and recommendations.  If you experience "the worst abdominal pain ever" or develop nausea or vomiting, please contact the office immediatly for further recommendations for treatment.  ITCHING  If you experience itching with your medications, try taking only a single pain pill, or even half a pain pill at a time.  You can also use Benadryl over the counter for itching or also to help with sleep.   MEDICATIONS See your medication summary on the "After Visit Summary" that the nursing staff will review with you prior to discharge.  You may have some home medications which will be placed on hold until you complete  the course of blood thinner medication.  It is important for you to complete the blood thinner medication as prescribed by your surgeon.  Continue your approved medications as instructed at time of discharge.  PRECAUTIONS If you experience chest pain or shortness of breath - call 911 immediately for transfer to the hospital emergency department.  If you develop a fever greater that 101 F, purulent drainage from wound, increased redness or drainage from wound, foul odor from the wound/dressing, or calf pain - CONTACT YOUR SURGEON.                                                   FOLLOW-UP APPOINTMENTS Make sure you keep all of your appointments after your operation with your surgeon and caregivers. You should call the office at the above phone number and make an appointment for approximately two weeks after the date of your surgery or on the date instructed by your surgeon outlined in the "  After Visit Summary".  RANGE OF MOTION AND STRENGTHENING EXERCISES  These exercises are designed to help you keep full movement of your hip joint. Follow your caregiver's or physical therapist's instructions. Perform all exercises about fifteen times, three times per day or as directed. Exercise both hips, even if you have had only one joint replacement. These exercises can be done on a training (exercise) mat, on the floor, on a table or on a bed. Use whatever works the best and is most comfortable for you. Use music or television while you are exercising so that the exercises are a pleasant break in your day. This will make your life better with the exercises acting as a break in routine you can look forward to.  Lying on your back, slowly slide your foot toward your buttocks, raising your knee up off the floor. Then slowly slide your foot back down until your leg is straight again.  Lying on your back spread your legs as far apart as you can without causing discomfort.  Lying on your side, raise your upper leg and  foot straight up from the floor as far as is comfortable. Slowly lower the leg and repeat.  Lying on your back, tighten up the muscle in the front of your thigh (quadriceps muscles). You can do this by keeping your leg straight and trying to raise your heel off the floor. This helps strengthen the largest muscle supporting your knee.  Lying on your back, tighten up the muscles of your buttocks both with the legs straight and with the knee bent at a comfortable angle while keeping your heel on the floor.   POST-OPERATIVE OPIOID TAPER INSTRUCTIONS: It is important to wean off of your opioid medication as soon as possible. If you do not need pain medication after your surgery it is ok to stop day one. Opioids include: Codeine, Hydrocodone(Norco, Vicodin), Oxycodone(Percocet, oxycontin) and hydromorphone amongst others.  Long term and even short term use of opiods can cause: Increased pain response Dependence Constipation Depression Respiratory depression And more.  Withdrawal symptoms can include Flu like symptoms Nausea, vomiting And more Techniques to manage these symptoms Hydrate well Eat regular healthy meals Stay active Use relaxation techniques(deep breathing, meditating, yoga) Do Not substitute Alcohol to help with tapering If you have been on opioids for less than two weeks and do not have pain than it is ok to stop all together.  Plan to wean off of opioids This plan should start within one week post op of your joint replacement. Maintain the same interval or time between taking each dose and first decrease the dose.  Cut the total daily intake of opioids by one tablet each day Next start to increase the time between doses. The last dose that should be eliminated is the evening dose.   IF YOU ARE TRANSFERRED TO A SKILLED REHAB FACILITY If the patient is transferred to a skilled rehab facility following release from the hospital, a list of the current medications will be sent  to the facility for the patient to continue.  When discharged from the skilled rehab facility, please have the facility set up the patient's [place] prior to being released. Also, the skilled facility will be responsible for providing the patient with their medications at time of release from the facility to include their pain medication, the muscle relaxants, and their blood thinner medication. If the patient is still at the rehab facility at time of the two week follow up  appointment, the skilled rehab facility will also need to assist the patient in arranging follow up appointment in our office and any transportation needs.  MAKE SURE YOU:  Understand these instructions.  Get help right away if you are not doing well or get worse.    DENTAL ANTIBIOTICS:  In most cases prophylactic antibiotics for Dental procdeures after total joint surgery are not necessary.  Exceptions are as follows:  1. History of prior total joint infection  2. Severely immunocompromised (Organ Transplant, cancer chemotherapy, Rheumatoid biologic meds such as [place])  3. Poorly controlled diabetes (A1C &gt; 8.0, blood glucose over 200)  If you have one of these conditions, contact your surgeon for an antibiotic prescription, prior to your dental procedure.    Pick up stool softner and laxative for home use following surgery while on pain medications. Do not submerge incision under water. Please use good hand washing techniques while changing dressing each day. May shower starting three days after surgery. Please use a clean towel to pat the incision dry following showers. Continue to use ice for pain and swelling after surgery. Do not use any lotions or creams on the incision until instructed by your surgeon.

## 2021-04-18 NOTE — Care Plan (Signed)
Ortho Bundle Case Management Note  Patient Details  Name: Cassandra Bailey MRN: [id number] Date of Birth: [birth year]                  R THA on 04/18/21. DCP: Home with children. 1 story home with 3 steps. DME: RW & 3in1 ordered through [facility]. PT: HEP   DME Arranged:  3-N-1, Walker rolling DME Agency:  [employer]  HH Arranged:    [place] Agency:     Additional Comments: Please contact me with any questions of if this plan should need to change.  Marianne Sofia, RN,CCM [facility]  [phone] 04/18/2021, 2:51 PM

## 2021-04-18 NOTE — Anesthesia Postprocedure Evaluation (Signed)
Anesthesia Post Note  Patient: Cassandra Bailey  Procedure(s) Performed: TOTAL HIP ARTHROPLASTY ANTERIOR APPROACH (Right: Hip)     Patient location during evaluation: PACU Anesthesia Type: MAC and Spinal Level of consciousness: awake and alert Pain management: pain level controlled Vital Signs Assessment: post-procedure vital signs reviewed and stable Respiratory status: spontaneous breathing, nonlabored ventilation, respiratory function stable and patient connected to nasal cannula oxygen Cardiovascular status: blood pressure returned to baseline and stable Postop Assessment: no apparent nausea or vomiting Anesthetic complications: no   No notable events documented.  Last Vitals:  Vitals:   04/18/21 1243 04/18/21 1448  BP: 110/62 109/65  Pulse: 67 76  Resp: 18   Temp: 36.4 C 36.7 C  SpO2: 99% 99%    Last Pain:  Vitals:   04/18/21 1448  TempSrc: Oral  PainSc:                  March Rummage Kadelyn Dimascio

## 2021-04-19 ENCOUNTER — Encounter (HOSPITAL_COMMUNITY): Payer: Self-pay | Admitting: Orthopedic Surgery

## 2021-04-19 DIAGNOSIS — Z7901 Long term (current) use of anticoagulants: Secondary | ICD-10-CM | POA: Diagnosis not present

## 2021-04-19 DIAGNOSIS — Z79899 Other long term (current) drug therapy: Secondary | ICD-10-CM | POA: Diagnosis not present

## 2021-04-19 DIAGNOSIS — I10 Essential (primary) hypertension: Secondary | ICD-10-CM | POA: Diagnosis not present

## 2021-04-19 DIAGNOSIS — I2510 Atherosclerotic heart disease of native coronary artery without angina pectoris: Secondary | ICD-10-CM | POA: Diagnosis not present

## 2021-04-19 DIAGNOSIS — M1611 Unilateral primary osteoarthritis, right hip: Secondary | ICD-10-CM | POA: Diagnosis not present

## 2021-04-19 LAB — BASIC METABOLIC PANEL
Anion gap: 6 (ref 5–15)
Anion gap: 6 (ref 5–15)
BUN: 17 mg/dL (ref 8–23)
BUN: 13 mg/dL (ref 8–23)
CO2: 28 mmol/L (ref 22–32)
CO2: 28 mmol/L (ref 22–32)
Calcium: 8 mg/dL — ABNORMAL LOW (ref 8.9–10.3)
Calcium: 8.1 mg/dL — ABNORMAL LOW (ref 8.9–10.3)
Chloride: 104 mmol/L (ref 98–111)
Chloride: 101 mmol/L (ref 98–111)
Creatinine, Ser: 0.65 mg/dL (ref 0.44–1.00)
Creatinine, Ser: 0.64 mg/dL (ref 0.44–1.00)
GFR, Estimated: >60 mL/min (ref >60)
GFR, Estimated: >60 mL/min (ref >60)
Glucose, Bld: 130 mg/dL — ABNORMAL HIGH (ref 70–99)
Glucose, Bld: 155 mg/dL — ABNORMAL HIGH (ref 70–99)
Potassium: 2.9 mmol/L — ABNORMAL LOW (ref 3.5–5.1)
Potassium: 3.2 mmol/L — ABNORMAL LOW (ref 3.5–5.1)
Sodium: 138 mmol/L (ref 135–145)
Sodium: 135 mmol/L (ref 135–145)

## 2021-04-19 LAB — CBC
HCT: 33.3 % — ABNORMAL LOW (ref 36.0–46.0)
Hemoglobin: 11.1 g/dL — ABNORMAL LOW (ref 12.0–15.0)
MCH: 30.6 pg (ref 26.0–34.0)
MCHC: 33.3 g/dL (ref 30.0–36.0)
MCV: 91.7 fL (ref 80.0–100.0)
Platelets: 228 10*3/uL (ref 150–400)
RBC: 3.63 MIL/uL — ABNORMAL LOW (ref 3.87–5.11)
RDW: 13.5 % (ref 11.5–15.5)
WBC: 10 10*3/uL (ref 4.0–10.5)
nRBC: 0 % (ref 0.0–0.2)

## 2021-04-19 MED ORDER — HYDROMORPHONE HCL 2 MG PO TABS: 2.0000 mg | ORAL_TABLET | Freq: Four times a day (QID) | ORAL | 0 refills | Status: DC | PRN

## 2021-04-19 MED ORDER — POTASSIUM CHLORIDE CRYS ER 20 MEQ PO TBCR
40.0000 meq | EXTENDED_RELEASE_TABLET | ORAL | Status: AC
Start: 2021-04-19 — End: 2021-04-19
  Administered 2021-04-19 (×3): 40 meq via ORAL
  Filled 2021-04-19 (×3): qty 2

## 2021-04-19 MED ORDER — ASPIRIN 325 MG PO TBEC: 325.0000 mg | DELAYED_RELEASE_TABLET | Freq: Two times a day (BID) | ORAL | 0 refills | Status: AC

## 2021-04-19 MED ORDER — METHOCARBAMOL 500 MG PO TABS: 500.0000 mg | ORAL_TABLET | Freq: Four times a day (QID) | ORAL | 0 refills | Status: DC | PRN

## 2021-04-19 MED ORDER — TRAMADOL HCL 50 MG PO TABS: 50.0000 mg | ORAL_TABLET | Freq: Four times a day (QID) | ORAL | 0 refills | Status: DC | PRN

## 2021-04-19 NOTE — Progress Notes (Addendum)
Physical Therapy Treatment Patient Details Name: Cassandra Bailey MRN: [id number] DOB: 11-15-1945 Today's Date: 04/19/2021    History of Present Illness Patient is 75 y.o. female s/p Rt THA anterior approach on 04/18/21 with PMH signifcant for OA, CAD, HTN, Stroke, loop recorder placed in 2019.    PT Comments    The patient is very anxious, somewhat "stiff" in both legs when attempts to mobilize.  Patient only ambulated x 5', stating feels dizzy, much difficulty advancing the right leg. Patient unsteady so recliner brought up. Multimodal cues for safety use of RW and sequencing.  Patient has not progressed well enough to consider Dc home today. Will see in PM for ambulation.  BP132/70, HR 106.  Follow Up Recommendations  Follow surgeon's recommendation for DC plan and follow-up therapies;Home health PT     Equipment Recommendations  Rolling walker with 5" wheels;3in1 (PT)    Recommendations for Other Services       Precautions / Restrictions Precautions Precautions: Fall Restrictions Other Position/Activity Restrictions: WBAT    Mobility  Bed Mobility Overal bed mobility: Needs Assistance Bed Mobility: Supine to Sit     Supine to sit: Mod assist;HOB elevated     General bed mobility comments: cues for use of bed rail and assist to bring bil LE's off EOB and use of bed pad to pivot. rehab tech assisting to bring trunk fully up as well.    Transfers Overall transfer level: Needs assistance   Transfers: Sit to/from Stand Sit to Stand: Mod assist;From elevated surface         General transfer comment: cues for hand placement and Mod Assist to rise from EOB.  Ambulation/Gait Ambulation/Gait assistance: Mod assist Gait Distance (Feet): 5 Feet Assistive device: Rolling walker (2 wheeled) Gait Pattern/deviations: Step-to pattern;Decreased stride length;Decreased weight shift to right Gait velocity: decr   General Gait Details: pt's trunk flexed initially patient.  Assist neeed to advance walker and cues for step pattern. Quite slow with LOB noted,very  anxious and reports feeling dizzy. chair brought up   Stairs             Wheelchair Mobility    Modified Rankin (Stroke Patients Only)       Balance Overall balance assessment: Needs assistance Sitting-balance support: Feet supported Sitting balance-Leahy Scale: Fair     Standing balance support: During functional activity;Bilateral upper extremity supported Standing balance-Leahy Scale: Poor Standing balance comment: posterior lLOB , steady assistance required                            Cognition Arousal/Alertness: Awake/alert Behavior During Therapy: Anxious Overall Cognitive Status: Within Functional Limits for tasks assessed                                 General Comments: patient frequently stating that she is slow, tired      Exercises Total Joint Exercises Ankle Circles/Pumps: AROM;Both;20 reps Heel Slides: AAROM;Right;10 reps Hip ABduction/ADduction: AAROM;Right;10 reps    General Comments        Pertinent Vitals/Pain Faces Pain Scale: Hurts worst Pain Location: Rt hip Pain Descriptors / Indicators: Aching;Discomfort;Guarding;Moaning Pain Intervention(s): Premedicated before session;Monitored during session;Ice applied    Home Living                      Prior Function  PT Goals (current goals can now be found in the care plan section)      Frequency    7X/week      PT Plan Current plan remains appropriate    Co-evaluation              AM-PAC PT "6 Clicks" Mobility   Outcome Measure  Help needed turning from your back to your side while in a flat bed without using bedrails?: A Lot Help needed moving from lying on your back to sitting on the side of a flat bed without using bedrails?: A Lot Help needed moving to and from a bed to a chair (including a wheelchair)?: A Lot Help needed standing  up from a chair using your arms (e.g., wheelchair or bedside chair)?: A Lot Help needed to walk in hospital room?: A Lot Help needed climbing 3-5 steps with a railing? : A Lot 6 Click Score: 12    End of Session Equipment Utilized During Treatment: Gait belt Activity Tolerance: Patient limited by fatigue;Patient limited by pain Patient left: in chair;with call bell/phone within reach;with chair alarm set Nurse Communication: Mobility status PT Visit Diagnosis: Muscle weakness (generalized) (M62.81);Difficulty in walking, not elsewhere classified (R26.2);Pain Pain - Right/Left: Right Pain - part of body: Hip     Time: [id number] PT Time Calculation (min) (ACUTE ONLY): 33 min  Charges:  $Gait Training: 8-22 mins $Therapeutic Exercise: 8-22 mins                     Tresa Endo PT Acute Rehabilitation Services Pager [phone] Office [phone]    Claretha Cooper 04/19/2021, 1:11 PM

## 2021-04-19 NOTE — Plan of Care (Signed)
  Problem: Elimination: Goal: Will not experience complications related to bowel motility Outcome: Progressing   Problem: Elimination: Goal: Will not experience complications related to urinary retention Outcome: Progressing   Problem: Nutrition: Goal: Adequate nutrition will be maintained Outcome: Progressing   Problem: Activity: Goal: Risk for activity intolerance will decrease Outcome: Progressing

## 2021-04-19 NOTE — Progress Notes (Signed)
Multiple complaints of pain overnight that is reduced some this a.m. Patient refused removal of foley catheter.

## 2021-04-19 NOTE — Progress Notes (Signed)
Foley removed per provider order. Peri care completed. Patient tolerated well and educated regarding need to spontaneously void prior to discharge.

## 2021-04-19 NOTE — Progress Notes (Signed)
   Subjective: 1 Day Post-Op Procedure(s) (LRB): TOTAL HIP ARTHROPLASTY ANTERIOR APPROACH (Right) Patient reports pain as mild and moderate.   Patient seen in rounds by Dr. Wynelle Link. Patient is well, and has had no acute complaints or problems. Denies SOB, chest pain, or calf pain. No acute overnight events. Ambulated 10 feet with therapy yesterday. Will continue therapy today. Potassium low this morning. 40 mEq potassium ordered this am. Will continue to monitor.     Objective: Vital signs in last 24 hours: Temp:  [95.5 F (35.3 C)-98.9 F (37.2 C)] 98.9 F (37.2 C) (08/25 0501) Pulse Rate:  [56-88] 86 (08/25 0501) Resp:  [10-33] 17 (08/25 0501) BP: (86-132)/(47-72) 123/60 (08/25 0501) SpO2:  [93 %-100 %] 97 % (08/25 0501)  Intake/Output from previous day:  Intake/Output Summary (Last 24 hours) at 04/19/2021 0751 Last data filed at 04/19/2021 0611 Gross per 24 hour  Intake 3536.27 ml  Output 1575 ml  Net 1961.27 ml     Intake/Output this shift: No intake/output data recorded.  Labs: Recent Labs    04/19/21 0317  HGB 11.1*   Recent Labs    04/19/21 0317  WBC 10.0  RBC 3.63*  HCT 33.3*  PLT 228   Recent Labs    04/19/21 0317  NA 138  K 2.9*  CL 104  CO2 28  BUN 17  CREATININE 0.65  GLUCOSE 130*  CALCIUM 8.0*   No results for input(s): LABPT, INR in the last 72 hours.  Exam: General - Patient is Alert and Oriented Extremity - Neurologically intact Neurovascular intact Intact pulses distally Dorsiflexion/Plantar flexion intact Dressing - dressing C/D/I Motor Function - intact, moving foot and toes well on exam.   Past Medical History:  Diagnosis Date   Arthritis    hips, knee, back   Coronary artery disease    History of kidney stones    Hypertension    Stroke ([place]) 11/09/2017    Assessment/Plan: 1 Day Post-Op Procedure(s) (LRB): TOTAL HIP ARTHROPLASTY ANTERIOR APPROACH (Right) Principal Problem:   OA (osteoarthritis) of hip Active  Problems:   Primary osteoarthritis of right hip  Estimated body mass index is 27.5 kg/m as calculated from the following:   Height as of this encounter: 5' 6.5" (1.689 m).   Weight as of this encounter: 78.5 kg. Advance diet Up with therapy  DVT Prophylaxis - TED hose and Plavix Weight bearing as tolerated. Continue therapy.  40 mEq potassium ordered this am. Will continue to monitor.    Plan is to go Home after hospital stay.   Plan for two sessions with PT this morning, and if meeting goals, will plan for discharge this afternoon.   Patient to follow up in two weeks with Dr. Wynelle Link in clinic.   The PDMP database was reviewed today prior to any opioid medications being prescribed to this patient.   Fenton Foy, [place], PA-C Orthopedic Surgery [phone] 04/19/2021, 7:51 AM

## 2021-04-19 NOTE — Progress Notes (Signed)
Pt has had slower progression with physical therapy, is not yet meeting goals to be cleared for discharge. Will stay overnight and continue working with therapy tomorrow.   Theresa Duty, PA-C Orthopedic Surgery [facility] [facility]

## 2021-04-19 NOTE — Progress Notes (Signed)
Physical Therapy Treatment Patient Details Name: Cassandra Bailey MRN: [id number] DOB: 1946/01/20 Today's Date: 04/19/2021    History of Present Illness Patient is 75 y.o. female s/p Rt THA anterior approach on 04/18/21 with PMH signifcant for OA, CAD, HTN, Stroke, loop recorder placed in 2019.    PT Comments    The patient is progressing in mobility, continues to require mod assistance to stand up and for bed mobility. Ambulated x20', then 10' using RW, . Frequent cues  for sequence and safety. , position inside RW.  Patient complains of feeling "weak", slightly clammy.   BP 120/64. ,SPO2 90% RA. Patient has not progressed well enough to meet  PT goals for DC as of yet.  Continue PT.   Follow Up Recommendations  Follow surgeon's recommendation for DC plan and follow-up therapies;Home health PT     Equipment Recommendations  Rolling walker with 5" wheels;3in1 (PT)    Recommendations for Other Services       Precautions / Restrictions Precautions Precautions: Fall Restrictions Other Position/Activity Restrictions: WBAT    Mobility  Bed Mobility Overal bed mobility: Needs Assistance Bed Mobility: Sit to Supine     Supine to sit: Mod assist     General bed mobility comments: assisted with both legs back onto bed, Patient attempted placing left first but could not tolerate .and PT required to assist.    Transfers Overall transfer level: Needs assistance Equipment used: Rolling walker (2 wheeled) Transfers: Sit to/from Stand Sit to Stand: Min assist;Mod assist         General transfer comment: cues for hand placement and Mod Assist to rise from recliner and raised toilet, patient rises slowly and sits down very slowly, cues to place right leg forward.  Ambulation/Gait Ambulation/Gait assistance: Mod assist;+2 safety/equipment Gait Distance (Feet): 20 Feet (then 10') Assistive device: Rolling walker (2 wheeled) Gait Pattern/deviations: Step-to pattern;Decreased  weight shift to right;Decreased step length - right Gait velocity: decr   General Gait Details: cues for posture and position inside RW, tends to place RW too far ahead.Multimodal cues to focus on sequence.   Stairs             Wheelchair Mobility    Modified Rankin (Stroke Patients Only)       Balance Overall balance assessment: Needs assistance Sitting-balance support: Feet supported Sitting balance-Leahy Scale: Fair     Standing balance support: During functional activity;No upper extremity supported Standing balance-Leahy Scale: Poor Standing balance comment: able to stand and pull up panties with min support                            Cognition Arousal/Alertness: Awake/alert Behavior During Therapy: Anxious;WFL for tasks assessed/performed Overall Cognitive Status: Within Functional Limits for tasks assessed                                 General Comments: frequent cues to stay focussed on position inside RW and ambulation sequence      Exercises Total Joint Exercises Ankle Circles/Pumps: AROM;Both;20 reps Heel Slides: AAROM;Right;10 reps Hip ABduction/ADduction: AAROM;Right;10 reps    General Comments        Pertinent Vitals/Pain Pain Assessment: 0-10 Pain Score: 4  Faces Pain Scale: Hurts worst Pain Location: Rt thigh- the "rod" they put in Pain Descriptors / Indicators: Grimacing;Discomfort;Operative site guarding;Pressure Pain Intervention(s): Monitored during session;Premedicated before session;Ice applied;Repositioned  Home Living                      Prior Function            PT Goals (current goals can now be found in the care plan section) Progress towards PT goals: Progressing toward goals    Frequency    7X/week      PT Plan Current plan remains appropriate    Co-evaluation              AM-PAC PT "6 Clicks" Mobility   Outcome Measure  Help needed turning from your back to your  side while in a flat bed without using bedrails?: A Lot Help needed moving from lying on your back to sitting on the side of a flat bed without using bedrails?: A Lot Help needed moving to and from a bed to a chair (including a wheelchair)?: A Lot Help needed standing up from a chair using your arms (e.g., wheelchair or bedside chair)?: A Lot Help needed to walk in hospital room?: A Lot Help needed climbing 3-5 steps with a railing? : A Lot 6 Click Score: 12    End of Session Equipment Utilized During Treatment: Gait belt Activity Tolerance: Patient tolerated treatment well Patient left: in bed;with call bell/phone within reach;with bed alarm set;with family/visitor present Nurse Communication: Mobility status PT Visit Diagnosis: Muscle weakness (generalized) (M62.81);Difficulty in walking, not elsewhere classified (R26.2);Pain Pain - Right/Left: Right Pain - part of body: Hip     Time: 1330-1405 PT Time Calculation (min) (ACUTE ONLY): 35 min  Charges:  $Gait Training: 8-22 mins $$Self Care/Home Management: [place] Pager [phone] Office [phone]   Claretha Cooper 04/19/2021, 3:26 PM

## 2021-04-19 NOTE — TOC Transition Note (Signed)
Transition of Care [facility]) - CM/SW Discharge Note  Patient Details  Name: SHUKRI NISTLER MRN: [id number] Date of Birth: 04/26/1946  [facility] Care [facility] CM/SW Contact:  Sherie Don, LCSW Phone Number: 04/19/2021, 10:13 AM  Clinical Narrative: Patient is expected to discharge after working with PT. CSW met with patient to confirm discharge plan and needs. Patient will discharge home with a home exercise program (HEP). Patient will need a rolling walker and 3N1. [employer] to deliver DME to patient's room. TOC signing off.  Final next level of care: Home/Self Care Barriers to Discharge: No Barriers Identified  Patient Goals and CMS Choice Patient states their goals for this hospitalization and ongoing recovery are:: Discharge home with HEP CMS Medicare.gov Compare Post Acute Care list provided to:: Patient Choice offered to / list presented to : Patient  Discharge Plan and Services         DME Arranged: 3-N-1, Walker rolling DME Agency: [employer] Representative spoke with at DME Agency: Pre-arranged in orthopedist's office  Readmission Risk Interventions No flowsheet data found.

## 2021-04-20 DIAGNOSIS — I10 Essential (primary) hypertension: Secondary | ICD-10-CM | POA: Diagnosis not present

## 2021-04-20 DIAGNOSIS — M1611 Unilateral primary osteoarthritis, right hip: Secondary | ICD-10-CM | POA: Diagnosis not present

## 2021-04-20 DIAGNOSIS — Z79899 Other long term (current) drug therapy: Secondary | ICD-10-CM | POA: Diagnosis not present

## 2021-04-20 DIAGNOSIS — I2510 Atherosclerotic heart disease of native coronary artery without angina pectoris: Secondary | ICD-10-CM | POA: Diagnosis not present

## 2021-04-20 DIAGNOSIS — Z7901 Long term (current) use of anticoagulants: Secondary | ICD-10-CM | POA: Diagnosis not present

## 2021-04-20 LAB — BASIC METABOLIC PANEL
Anion gap: 8 (ref 5–15)
BUN: 16 mg/dL (ref 8–23)
CO2: 28 mmol/L (ref 22–32)
Calcium: 8.7 mg/dL — ABNORMAL LOW (ref 8.9–10.3)
Chloride: 102 mmol/L (ref 98–111)
Creatinine, Ser: 0.73 mg/dL (ref 0.44–1.00)
GFR, Estimated: >60 mL/min (ref >60)
Glucose, Bld: 124 mg/dL — ABNORMAL HIGH (ref 70–99)
Potassium: 4.3 mmol/L (ref 3.5–5.1)
Sodium: 138 mmol/L (ref 135–145)

## 2021-04-20 LAB — CBC
HCT: 33.8 % — ABNORMAL LOW (ref 36.0–46.0)
Hemoglobin: 11.2 g/dL — ABNORMAL LOW (ref 12.0–15.0)
MCH: 30.5 pg (ref 26.0–34.0)
MCHC: 33.1 g/dL (ref 30.0–36.0)
MCV: 92.1 fL (ref 80.0–100.0)
Platelets: 256 10*3/uL (ref 150–400)
RBC: 3.67 MIL/uL — ABNORMAL LOW (ref 3.87–5.11)
RDW: 14 % (ref 11.5–15.5)
WBC: 16.5 10*3/uL — ABNORMAL HIGH (ref 4.0–10.5)
nRBC: 0 % (ref 0.0–0.2)

## 2021-04-20 MED ORDER — DIPHENHYDRAMINE HCL 12.5 MG/5ML PO ELIX
12.5000 mg | ORAL_SOLUTION | Freq: Four times a day (QID) | ORAL | Status: DC | PRN
  Administered 2021-04-20: 12.5 mg via ORAL
  Administered 2021-04-20: 25 mg via ORAL
  Filled 2021-04-20 (×2): qty 10

## 2021-04-20 NOTE — Progress Notes (Signed)
Physical Therapy Treatment Patient Details Name: Cassandra Bailey MRN: [id number] DOB: 10/05/1945 Today's Date: 04/20/2021    History of Present Illness Patient is 75 y.o. female s/p Rt THA anterior approach on 04/18/21 with PMH signifcant for OA, CAD, HTN, Stroke, loop recorder placed in 2019.    PT Comments    Pt making good progress today.  Able to ambulate 57' with RW and min guard and performed 2 steps with 1 rail.  Pt does need cues and frequent reassurance.  Provided detailed written instructions for exercises, stairs, and precautions/guidelines.  Encouraged to have family come for afternoon session to see stair training.     Follow Up Recommendations  Follow surgeon's recommendation for DC plan and follow-up therapies;Home health PT     Equipment Recommendations  Rolling walker with 5" wheels;3in1 (PT)    Recommendations for Other Services       Precautions / Restrictions Precautions Precautions: Fall Restrictions Other Position/Activity Restrictions: WBAT    Mobility  Bed Mobility               General bed mobility comments: in recliner    Transfers Overall transfer level: Needs assistance Equipment used: Rolling walker (2 wheeled) Transfers: Sit to/from Stand Sit to Stand: Min guard         General transfer comment: Cues for safe hand placement; min guard and increased time to rise.  Provided verbal cues and reassurance that she is doing correctly (hands to push up, backing to feel chair before sitting, reaching back to sit)  Ambulation/Gait Ambulation/Gait assistance: Min guard Gait Distance (Feet): 70 Feet Assistive device: Rolling walker (2 wheeled) Gait Pattern/deviations: Step-to pattern;Decreased stride length;Trunk flexed Gait velocity: decr   General Gait Details: Pt with slow gait but with good RW proximity today.  Does have trunk flexed - cued to stand straight as able.  Pt again very worried about doing exactly correct, asking if she is  turning foot out to much, going fast enough, etc.  Encouraged that she is doing well -important part is up and moving safely.  Encouraged not to worry about speed right now.  Reassured good RW proximity today.   Stairs Stairs: Yes Stairs assistance: Min guard Stair Management: One rail Right;Step to pattern;Sideways Number of Stairs: 2 General stair comments: up/down 2 steps with cues for sequencing and min guard for safety   Wheelchair Mobility    Modified Rankin (Stroke Patients Only)       Balance Overall balance assessment: Needs assistance Sitting-balance support: Feet supported Sitting balance-Leahy Scale: Good     Standing balance support: During functional activity;No upper extremity supported;Bilateral upper extremity supported Standing balance-Leahy Scale: Fair Standing balance comment: RW to ambulate but able to do toileting ADLs and waash hands without UE support                            Cognition Arousal/Alertness: Awake/alert Behavior During Therapy: Anxious Overall Cognitive Status: Within Functional Limits for tasks assessed                                 General Comments: Pt anxious about doing everything exactly correct requiring max cues and reassurance (suspect this is baseline)      Exercises Total Joint Exercises Ankle Circles/Pumps: AROM;Both;10 reps;Seated (reclined) Quad Sets: AROM;Both;10 reps;Seated (reclined) Heel Slides: AAROM;Right;10 reps;Seated (reclined - use of gait belt and min A  for AAROM) Hip ABduction/ADduction: AAROM;Right;10 reps;Seated (reclined - use of gait belt and min A for AAROM) Long Arc Quad: AROM;Right;10 reps;Seated    General Comments  Educated on safe ice use, no pivots, car transfers. Also, encouraged walking every 1-2 hours during day. Educated on HEP with focus on mobility the first weeks. Discussed doing exercises within pain control and if pain increasing could decreased ROM, reps, and  stop exercises as needed. Encouraged to perform quad sets and ankle pumps frequently for blood flow and to promote full knee extension.       Pertinent Vitals/Pain Pain Assessment: 0-10 Pain Score: 7  Pain Location: R thing Pain Descriptors / Indicators: Sore;Discomfort Pain Intervention(s): Limited activity within patient's tolerance;Monitored during session;Ice applied;Repositioned    Home Living                      Prior Function            PT Goals (current goals can now be found in the care plan section) Progress towards PT goals: Progressing toward goals    Frequency    7X/week      PT Plan Current plan remains appropriate    Co-evaluation              AM-PAC PT "6 Clicks" Mobility   Outcome Measure  Help needed turning from your back to your side while in a flat bed without using bedrails?: A Little Help needed moving from lying on your back to sitting on the side of a flat bed without using bedrails?: A Little Help needed moving to and from a bed to a chair (including a wheelchair)?: A Little Help needed standing up from a chair using your arms (e.g., wheelchair or bedside chair)?: A Little Help needed to walk in hospital room?: A Little Help needed climbing 3-5 steps with a railing? : A Little 6 Click Score: 18    End of Session Equipment Utilized During Treatment: Gait belt Activity Tolerance: Patient tolerated treatment well Patient left: with call bell/phone within reach;with family/visitor present;in chair;with chair alarm set Nurse Communication: Mobility status PT Visit Diagnosis: Muscle weakness (generalized) (M62.81);Difficulty in walking, not elsewhere classified (R26.2);Pain Pain - Right/Left: Right Pain - part of body: Hip     Time: [id number] PT Time Calculation (min) (ACUTE ONLY): 45 min  Charges:  $Gait Training: 8-22 mins $Therapeutic Exercise: 8-22 mins $Therapeutic Activity: 8-22 mins                     Abran Richard,  PT Acute Rehab Services Pager [phone] [facility] 04/20/2021, 11:25 AM

## 2021-04-20 NOTE — Plan of Care (Signed)
Discharge instructions given to the patient. °

## 2021-04-20 NOTE — Progress Notes (Signed)
   Subjective: 2 Days Post-Op Procedure(s) (LRB): TOTAL HIP ARTHROPLASTY ANTERIOR APPROACH (Right) Patient reports pain as mild.   Patient seen in rounds by Dr. Wynelle Link. Patient is well, and has had no acute complaints or problems. Denies CP or SOB. No issues overnight other than itching that resolved with benadryl. Plan is to go Home after hospital stay.  Objective: Vital signs in last 24 hours: Temp:  [98.4 F (36.9 C)-99.3 F (37.4 C)] 98.7 F (37.1 C) (08/26 0606) Pulse Rate:  [86-93] 86 (08/26 0606) Resp:  [12-16] 16 (08/26 0606) BP: (101-122)/(56-62) 101/56 (08/26 0606) SpO2:  [92 %-95 %] 93 % (08/26 0606)  Intake/Output from previous day:  Intake/Output Summary (Last 24 hours) at 04/20/2021 0825 Last data filed at 04/20/2021 0320 Gross per 24 hour  Intake 1493.34 ml  Output 625 ml  Net 868.34 ml    Intake/Output this shift: No intake/output data recorded.  Labs: Recent Labs    04/19/21 0317 04/20/21 0342  HGB 11.1* 11.2*   Recent Labs    04/19/21 0317 04/20/21 0342  WBC 10.0 16.5*  RBC 3.63* 3.67*  HCT 33.3* 33.8*  PLT 228 256   Recent Labs    04/19/21 1221 04/20/21 0342  NA 135 138  K 3.2* 4.3  CL 101 102  CO2 28 28  BUN 13 16  CREATININE 0.64 0.73  GLUCOSE 155* 124*  CALCIUM 8.1* 8.7*   No results for input(s): LABPT, INR in the last 72 hours.  Exam: General - Patient is Alert and Oriented Extremity - Neurologically intact Neurovascular intact Sensation intact distally Dorsiflexion/Plantar flexion intact Dressing/Incision - clean, dry, no drainage Motor Function - intact, moving foot and toes well on exam.   Past Medical History:  Diagnosis Date   Arthritis    hips, knee, back   Coronary artery disease    History of kidney stones    Hypertension    Stroke ([place]) 11/09/2017    Assessment/Plan: 2 Days Post-Op Procedure(s) (LRB): TOTAL HIP ARTHROPLASTY ANTERIOR APPROACH (Right) Principal Problem:   OA (osteoarthritis) of  hip Active Problems:   Primary osteoarthritis of right hip  Estimated body mass index is 27.5 kg/m as calculated from the following:   Height as of this encounter: 5' 6.5" (1.689 m).   Weight as of this encounter: 78.5 kg. Up with therapy  DVT Prophylaxis - Aspirin and Plavix Weight-bearing as tolerated  Plan for discharge once cleared by PT Follow-up in the office in 2 weeks   Theresa Duty, PA-C Orthopedic Surgery [phone] 04/20/2021, 8:25 AM

## 2021-04-20 NOTE — Progress Notes (Signed)
Physical Therapy Treatment Patient Details Name: Cassandra Bailey MRN: [id number] DOB: Jan 23, 1946 Today's Date: 04/20/2021    History of Present Illness Patient is 75 y.o. female s/p Rt THA anterior approach on 04/18/21 with PMH signifcant for OA, CAD, HTN, Stroke, loop recorder placed in 2019.    PT Comments    Pt making good progress.  Son and daughter in law present and observed/educated on HEP, stairs, transfers, and gait.  Pt demonstrates safe gait & transfers in order to return home from PT perspective with family once discharged by MD.  While in hospital, will continue to benefit from PT for skilled therapy to advance mobility and exercises.      Follow Up Recommendations  Follow surgeon's recommendation for DC plan and follow-up therapies;Home health PT     Equipment Recommendations  Rolling walker with 5" wheels;3in1 (PT)    Recommendations for Other Services       Precautions / Restrictions Precautions Precautions: Fall Restrictions Other Position/Activity Restrictions: WBAT    Mobility  Bed Mobility               General bed mobility comments: in recliner    Transfers Overall transfer level: Needs assistance Equipment used: Rolling walker (2 wheeled) Transfers: Sit to/from Stand Sit to Stand: Min guard         General transfer comment: Cues for safe hand placement; min guard and increased time to rise.  Provided verbal cues and reassurance that she is doing correctly (hands to push up, backing to feel chair before sitting, reaching back to sit)  Ambulation/Gait Ambulation/Gait assistance: Min guard Gait Distance (Feet): 80 Feet Assistive device: Rolling walker (2 wheeled) Gait Pattern/deviations: Step-to pattern;Decreased stride length;Trunk flexed     General Gait Details: Pt with slow gait but with good RW proximity today.  Does have trunk flexed - cued to stand straight as able.  Pt again very worried about doing exactly correct, asking if she is  turning foot out to much, going fast enough, etc.  Encouraged that she is doing well -important part is up and moving safely.  Encouraged not to worry about speed right now.  Reassured good RW proximity today.   Stairs Stairs: Yes Stairs assistance: Min guard Stair Management: One rail Right;Step to pattern;Sideways Number of Stairs: 2 General stair comments: up/down 2 steps with cues for sequencing and min guard for safety   Wheelchair Mobility    Modified Rankin (Stroke Patients Only)       Balance Overall balance assessment: Needs assistance Sitting-balance support: Feet supported Sitting balance-Leahy Scale: Good     Standing balance support: During functional activity;No upper extremity supported;Bilateral upper extremity supported Standing balance-Leahy Scale: Fair Standing balance comment: RW to ambulate but able to do toileting ADLs and waash hands without UE support                            Cognition Arousal/Alertness: Awake/alert Behavior During Therapy: Anxious Overall Cognitive Status: Within Functional Limits for tasks assessed                                 General Comments: Pt anxious about doing everything exactly correct requiring max cues and reassurance (suspect this is baseline)      Exercises Total Joint Exercises Ankle Circles/Pumps: AROM;Both;10 reps;Seated (reclined) Quad Sets: AROM;Both;10 reps;Seated (reclined) Heel Slides: AAROM;Right;10 reps;Seated (reclined - use of  gait belt and min A for AAROM) Hip ABduction/ADduction: AAROM;Right;10 reps;Seated (reclined - use of gait belt and min A for AAROM) Long Arc Quad: AROM;Right;10 reps;Seated    General Comments   Educated on safe ice use, no pivots, car transfers,  and TED hose during day. Also, encouraged walking every 1-2 hours during day. Educated on HEP with focus on mobility the first weeks. Discussed doing exercises within pain control and if pain increasing  could decreased ROM, reps, and stop exercises as needed. Encouraged to perform quad sets and ankle pumps frequently for blood flow and to promote full knee extension.      Pertinent Vitals/Pain Pain Assessment: 0-10 Pain Score: 4  Pain Location: R thigh Pain Descriptors / Indicators: Sore;Discomfort Pain Intervention(s): Limited activity within patient's tolerance;Monitored during session;Premedicated before session;Repositioned;Ice applied    Home Living                      Prior Function            PT Goals (current goals can now be found in the care plan section) Progress towards PT goals: Progressing toward goals    Frequency    7X/week      PT Plan Current plan remains appropriate    Co-evaluation              AM-PAC PT "6 Clicks" Mobility   Outcome Measure  Help needed turning from your back to your side while in a flat bed without using bedrails?: A Little Help needed moving from lying on your back to sitting on the side of a flat bed without using bedrails?: A Little Help needed moving to and from a bed to a chair (including a wheelchair)?: A Little Help needed standing up from a chair using your arms (e.g., wheelchair or bedside chair)?: A Little Help needed to walk in hospital room?: A Little Help needed climbing 3-5 steps with a railing? : A Little 6 Click Score: 18    End of Session Equipment Utilized During Treatment: Gait belt Activity Tolerance: Patient tolerated treatment well Patient left: with call bell/phone within reach;with family/visitor present;in chair;with chair alarm set Nurse Communication: Mobility status PT Visit Diagnosis: Muscle weakness (generalized) (M62.81);Difficulty in walking, not elsewhere classified (R26.2);Pain Pain - Right/Left: Right Pain - part of body: Hip     Time: [id number] PT Time Calculation (min) (ACUTE ONLY): 32 min  Charges:  $Gait Training: 8-22 mins $Therapeutic Exercise: 8-22 mins                      Cassandra Bailey, PT Acute Rehab Services Pager [phone] [facility] [place] 04/20/2021, 3:52 PM

## 2021-04-20 NOTE — Progress Notes (Signed)
Laurelyn Sickle PA notified of pt need for Benadryl due to itching from pain medication. Pt with no visible hives. Only redness on skin present was where pt was scratching her skin. Orders placed in EMR.

## 2021-04-20 NOTE — Progress Notes (Signed)
Cassandra Bailey, [place] to clearify the dose on Aspirin. As per [name], medication instructions given to the patient and family member to take Aspirin '325mg'$  for 20 days and 81 mg for 3 more weeks. Also instructed patient not to take Dilaudid because she had some itching prior administration.

## 2021-04-23 NOTE — Discharge Summary (Signed)
Physician Discharge Summary   Patient ID: Cassandra Bailey MRN: [id number] DOB/AGE: 12/23/1945 75 y.o.  Admit date: 04/18/2021 Discharge date: 04/20/2021  Primary Diagnosis: Osteoarthritis, right hip   Admission Diagnoses:  Past Medical History:  Diagnosis Date   Arthritis    hips, knee, back   Coronary artery disease    History of kidney stones    Hypertension    Stroke [facility]) 11/09/2017   Discharge Diagnoses:   Principal Problem:   OA (osteoarthritis) of hip Active Problems:   Primary osteoarthritis of right hip  Estimated body mass index is 27.5 kg/m as calculated from the following:   Height as of this encounter: 5' 6.5" (1.689 m).   Weight as of this encounter: 78.5 kg.  Procedure:  Procedure(s) (LRB): TOTAL HIP ARTHROPLASTY ANTERIOR APPROACH (Right)   Consults: None  HPI: Cassandra Bailey is a 75 y.o. female who has advanced end-stage arthritis of their Right  hip with progressively worsening pain and dysfunction.The patient has failed nonoperative management and presents for total hip arthroplasty.   Laboratory Data: Admission on 04/18/2021, Discharged on 04/20/2021  Component Date Value Ref Range Status   ABO/RH(D) 04/18/2021    Final                   Value:A POS Performed at [facility] [address], [place], [place] [address]    WBC 04/19/2021 10.0  4.0 - 10.5 K/uL Final   RBC 04/19/2021 3.63 (A) 3.87 - 5.11 MIL/uL Final   Hemoglobin 04/19/2021 11.1 (A) 12.0 - 15.0 g/dL Final   HCT 04/19/2021 33.3 (A) 36.0 - 46.0 % Final   MCV 04/19/2021 91.7  80.0 - 100.0 fL Final   MCH 04/19/2021 30.6  26.0 - 34.0 pg Final   MCHC 04/19/2021 33.3  30.0 - 36.0 g/dL Final   RDW 04/19/2021 13.5  11.5 - 15.5 % Final   Platelets 04/19/2021 228  150 - 400 K/uL Final   nRBC 04/19/2021 0.0  0.0 - 0.2 % Final   Performed at [facility], [place] [address] [place], [place] [address]   Sodium 04/19/2021 138  135 - 145 mmol/L Final    Potassium 04/19/2021 2.9 (A) 3.5 - 5.1 mmol/L Final   Chloride 04/19/2021 104  98 - 111 mmol/L Final   CO2 04/19/2021 28  22 - 32 mmol/L Final   Glucose, Bld 04/19/2021 130 (A) 70 - 99 mg/dL Final   Glucose reference range applies only to samples taken after fasting for at least 8 hours.   BUN 04/19/2021 17  8 - 23 mg/dL Final   Creatinine, Ser 04/19/2021 0.65  0.44 - 1.00 mg/dL Final   Calcium 04/19/2021 8.0 (A) 8.9 - 10.3 mg/dL Final   GFR, Estimated 04/19/2021 >60  >60 mL/min Final   Comment: (NOTE) Calculated using the CKD-EPI Creatinine Equation (2021)    Anion gap 04/19/2021 6  5 - 15 Final   Performed at [facility], [place] [address] [place], [place] [address]   Sodium 04/19/2021 135  135 - 145 mmol/L Final   Potassium 04/19/2021 3.2 (A) 3.5 - 5.1 mmol/L Final   Chloride 04/19/2021 101  98 - 111 mmol/L Final   CO2 04/19/2021 28  22 - 32 mmol/L Final   Glucose, Bld 04/19/2021 155 (A) 70 - 99 mg/dL Final   Glucose reference range applies only to samples taken after fasting for at least 8 hours.   BUN 04/19/2021 13  8 -  23 mg/dL Final   Creatinine, Ser 04/19/2021 0.64  0.44 - 1.00 mg/dL Final   Calcium 04/19/2021 8.1 (A) 8.9 - 10.3 mg/dL Final   GFR, Estimated 04/19/2021 >60  >60 mL/min Final   Comment: (NOTE) Calculated using the CKD-EPI Creatinine Equation (2021)    Anion gap 04/19/2021 6  5 - 15 Final   Performed at [facility], [place] [address], [place], [place] [address]   WBC 04/20/2021 16.5 (A) 4.0 - 10.5 K/uL Final   RBC 04/20/2021 3.67 (A) 3.87 - 5.11 MIL/uL Final   Hemoglobin 04/20/2021 11.2 (A) 12.0 - 15.0 g/dL Final   HCT 04/20/2021 33.8 (A) 36.0 - 46.0 % Final   MCV 04/20/2021 92.1  80.0 - 100.0 fL Final   MCH 04/20/2021 30.5  26.0 - 34.0 pg Final   MCHC 04/20/2021 33.1  30.0 - 36.0 g/dL Final   RDW 04/20/2021 14.0  11.5 - 15.5 % Final   Platelets 04/20/2021 256  150 - 400 K/uL Final   nRBC 04/20/2021 0.0  0.0 - 0.2 % Final    Performed at [facility], [place] [address], [place], [place] [address]   Sodium 04/20/2021 138  135 - 145 mmol/L Final   Potassium 04/20/2021 4.3  3.5 - 5.1 mmol/L Final   DELTA CHECK NOTED   Chloride 04/20/2021 102  98 - 111 mmol/L Final   CO2 04/20/2021 28  22 - 32 mmol/L Final   Glucose, Bld 04/20/2021 124 (A) 70 - 99 mg/dL Final   Glucose reference range applies only to samples taken after fasting for at least 8 hours.   BUN 04/20/2021 16  8 - 23 mg/dL Final   Creatinine, Ser 04/20/2021 0.73  0.44 - 1.00 mg/dL Final   Calcium 04/20/2021 8.7 (A) 8.9 - 10.3 mg/dL Final   GFR, Estimated 04/20/2021 >60  >60 mL/min Final   Comment: (NOTE) Calculated using the CKD-EPI Creatinine Equation (2021)    Anion gap 04/20/2021 8  5 - 15 Final   Performed at [facility], [place] [address], [place], [place] [address]  Orders Only on 04/16/2021  Component Date Value Ref Range Status   SARS Coronavirus 2 04/16/2021 RESULT: NEGATIVE   Final   Comment: RESULT: NEGATIVESARS-CoV-2 INTERPRETATION:A NEGATIVE  test result means that SARS-CoV-2 RNA was not present in the specimen above the limit of detection of this test. This does not preclude a possible SARS-CoV-2 infection and should not be used as the  sole basis for patient management decisions. Negative results must be combined with clinical observations, patient history, and epidemiological information. Optimum specimen types and timing for peak viral levels during infections caused by SARS-CoV-2  have not been determined. Collection of multiple specimens or types of specimens may be necessary to detect virus. Improper specimen collection and handling, sequence variability under primers/probes, or organism present below the limit of detection may  lead to false negative results. Positive and negative predictive values of testing are highly dependent on prevalence. False negative test results are more likely when  prevalence of disease is high.The expected result is NEGATIVE.Fact S                          heet for  Healthcare Providers: LocalChronicle.no Sheet for Patients: SalonLookup.es Reference Range - Negative   Hospital Outpatient Visit on 04/05/2021  Component Date Value Ref Range Status   WBC 04/05/2021 4.8  4.0 - 10.5 K/uL Final  RBC 04/05/2021 4.26  3.87 - 5.11 MIL/uL Final   Hemoglobin 04/05/2021 12.9  12.0 - 15.0 g/dL Final   HCT 04/05/2021 39.5  36.0 - 46.0 % Final   MCV 04/05/2021 92.7  80.0 - 100.0 fL Final   MCH 04/05/2021 30.3  26.0 - 34.0 pg Final   MCHC 04/05/2021 32.7  30.0 - 36.0 g/dL Final   RDW 04/05/2021 13.9  11.5 - 15.5 % Final   Platelets 04/05/2021 328  150 - 400 K/uL Final   nRBC 04/05/2021 0.0  0.0 - 0.2 % Final   Performed at [facility], [place] [address] [place], [place] [address]   Sodium 04/05/2021 141  135 - 145 mmol/L Final   Potassium 04/05/2021 3.8  3.5 - 5.1 mmol/L Final   Chloride 04/05/2021 101  98 - 111 mmol/L Final   CO2 04/05/2021 30  22 - 32 mmol/L Final   Glucose, Bld 04/05/2021 115 (A) 70 - 99 mg/dL Final   Glucose reference range applies only to samples taken after fasting for at least 8 hours.   BUN 04/05/2021 16  8 - 23 mg/dL Final   Creatinine, Ser 04/05/2021 0.61  0.44 - 1.00 mg/dL Final   Calcium 04/05/2021 9.9  8.9 - 10.3 mg/dL Final   Total Protein 04/05/2021 7.3  6.5 - 8.1 g/dL Final   Albumin 04/05/2021 4.3  3.5 - 5.0 g/dL Final   AST 04/05/2021 18  15 - 41 U/L Final   ALT 04/05/2021 13  0 - 44 U/L Final   Alkaline Phosphatase 04/05/2021 64  38 - 126 U/L Final   Total Bilirubin 04/05/2021 0.6  0.3 - 1.2 mg/dL Final   GFR, Estimated 04/05/2021 >60  >60 mL/min Final   Comment: (NOTE) Calculated using the CKD-EPI Creatinine Equation (2021)    Anion gap 04/05/2021 10  5 - 15 Final   Performed at [facility], [place] [address],  [place], [place] [address]   ABO/RH(D) 04/05/2021 A POS   Final   Antibody Screen 04/05/2021 NEG   Final   Sample Expiration 04/05/2021 04/19/2021,2359   Final   Extend sample reason 04/05/2021    Final                   Value:NO TRANSFUSIONS OR PREGNANCY IN THE PAST 3 MONTHS Performed at [facility] [address], [place], [place] [address]    Prothrombin Time 04/05/2021 11.3 (A) 11.4 - 15.2 seconds Final   INR 04/05/2021 0.8  0.8 - 1.2 Final   Comment: (NOTE) INR goal varies based on device and disease states. Performed at [facility], [place] [address], [place], [place] [address]    MRSA, PCR 04/05/2021 NEGATIVE  NEGATIVE Final   Staphylococcus aureus 04/05/2021 NEGATIVE  NEGATIVE Final   Comment: (NOTE) The Xpert SA Assay (FDA approved for NASAL specimens in patients 47 years of age and older), is one component of a comprehensive surveillance program. It is not intended to diagnose infection nor to guide or monitor treatment. Performed at [facility], [place] [address], [place], [place] [address]   Appointment on 03/29/2021  Component Date Value Ref Range Status   Date Time Interrogation Session 03/27/2021 [id number]   Final   Pulse Generator Manufacturer 03/27/2021 MERM   Final   Pulse Gen Model 03/27/2021 LNQ11 Reveal LINQ   Final   Pulse Gen Serial Number 03/27/2021 [id number]   Final   Clinic Name 03/27/2021 [facility]  Final   Implantable Pulse Generator Type 03/27/2021 ICM/ILR   Final   Implantable Pulse Generator Implan* 03/27/2021 [id number]   Final  Clinical Support on 02/27/2021  Component Date Value Ref Range Status   Date Time Interrogation Session 02/24/2021 [id number]   Final   Pulse Generator Manufacturer 02/24/2021 MERM   Final   Pulse Gen Model 02/24/2021 LNQ11 Reveal LINQ   Final   Pulse Gen Serial Number 02/24/2021 [id number]   Final   Clinic Name 02/24/2021 [facility]   Final    Implantable Pulse Generator Type 02/24/2021 ICM/ILR   Final   Implantable Pulse Generator Implan* 02/24/2021 [id number]   Final     X-Rays:DG Pelvis Portable  Result Date: 04/18/2021 CLINICAL DATA:  Status post right total hip replacement. EXAM: PORTABLE PELVIS 1-2 VIEWS COMPARISON:  None. FINDINGS: The right femoral and acetabular components are well situated. Expected postoperative changes are seen in the surrounding soft tissues. IMPRESSION: Status post right total hip arthroplasty. Electronically Signed   By: Marijo Conception M.D.   On: 04/18/2021 13:22   DG C-Arm 1-60 Min-No Report  Result Date: 04/18/2021 Fluoroscopy was utilized by the requesting physician.  No radiographic interpretation.   CUP PACEART REMOTE DEVICE CHECK  Result Date: 03/28/2021 ILR summary report received. Battery status OK. Normal device function. No new symptom, tachy, brady, or pause episodes. No new AF episodes. Monthly summary reports and ROV/PRN LR  DG HIP OPERATIVE UNILAT W OR W/O PELVIS RIGHT  Result Date: 04/18/2021 CLINICAL DATA:  75 year old female undergoing hip replacement. EXAM: OPERATIVE RIGHT HIP (WITH PELVIS IF PERFORMED) 2 VIEWS TECHNIQUE: Fluoroscopic spot image(s) were submitted for interpretation post-operatively. COMPARISON:  CT Abdomen and Pelvis 11/09/2018. FINDINGS: 2 intraoperative fluoroscopic AP spot views of the right hip and lower pelvis. Bipolar hip arthroplasty hardware in place with normal AP alignment. No unexpected osseous changes. FLUOROSCOPY TIME:  0 minutes 4 seconds IMPRESSION: Right bipolar hip arthroplasty with no adverse features. Electronically Signed   By: Genevie Ann M.D.   On: 04/18/2021 10:25    EKG: Orders placed or performed during the hospital encounter of 12/04/17   EKG 12-Lead   EKG 12-Lead   ED EKG within 10 minutes   ED EKG within 10 minutes   EKG     Hospital Course: Cassandra Bailey is a 75 y.o. who was admitted to [facility]. They were brought to the  operating room on 04/18/2021 and underwent Procedure(s): [place].  Patient tolerated the procedure well and was later transferred to the recovery room and then to the orthopaedic floor for postoperative care. They were given PO and IV analgesics for pain control following their surgery. They were given 24 hours of postoperative antibiotics of  Anti-infectives (From admission, onward)    Start     Dose/Rate Route Frequency Ordered Stop   04/18/21 1500  ceFAZolin (ANCEF) IVPB 2g/100 mL premix        2 g 200 mL/hr over 30 Minutes Intravenous Every 6 hours 04/18/21 1237 04/18/21 2022   04/18/21 0645  ceFAZolin (ANCEF) IVPB 2g/100 mL premix        2 g 200 mL/hr over 30 Minutes Intravenous On call to O.R. 04/18/21 [id number] 04/18/21 0903      and started on DVT prophylaxis in the form of Aspirin and Plavix .   PT and OT were ordered for total joint protocol. Discharge planning consulted to help with postop disposition and equipment needs. Patient had a good  night on the evening of surgery. They started to get up OOB with therapy on POD #0. Had slower progression with PT, prompting additional night stay. Continued to work with therapy into POD #2. Pt was seen during rounds on day two and was ready to go home pending progress with therapy. Dressing was changed and the incision was clean, dry, and intact. Pt worked with therapy for two additional sessions and was meeting their goals. She was discharged to home later that day in stable condition.  Diet: Cardiac diet Activity: WBAT Follow-up: in 2 weeks Disposition: Home with HEP Discharged Condition: stable   Discharge Instructions     Call MD / Call 911   Complete by: As directed    If you experience chest pain or shortness of breath, CALL 911 and be transported to the hospital emergency room.  If you develope a fever above 101 F, pus (white drainage) or increased drainage or redness at the wound, or calf pain, call your  surgeon's office.   Change dressing   Complete by: As directed    You have an adhesive waterproof bandage over the incision. Leave this in place until your first follow-up appointment. Once you remove this you will not need to place another bandage.   Constipation Prevention   Complete by: As directed    Drink plenty of fluids.  Prune juice may be helpful.  You may use a stool softener, such as Colace (over the counter) 100 mg twice a day.  Use MiraLax (over the counter) for constipation as needed.   Diet - low sodium heart healthy   Complete by: As directed    Do not sit on low chairs, stoools or toilet seats, as it may be difficult to get up from low surfaces   Complete by: As directed    Driving restrictions   Complete by: As directed    No driving for two weeks   Post-operative opioid taper instructions:   Complete by: As directed    POST-OPERATIVE OPIOID TAPER INSTRUCTIONS: It is important to wean off of your opioid medication as soon as possible. If you do not need pain medication after your surgery it is ok to stop day one. Opioids include: Codeine, Hydrocodone(Norco, Vicodin), Oxycodone(Percocet, oxycontin) and hydromorphone amongst others.  Long term and even short term use of opiods can cause: Increased pain response Dependence Constipation Depression Respiratory depression And more.  Withdrawal symptoms can include Flu like symptoms Nausea, vomiting And more Techniques to manage these symptoms Hydrate well Eat regular healthy meals Stay active Use relaxation techniques(deep breathing, meditating, yoga) Do Not substitute Alcohol to help with tapering If you have been on opioids for less than two weeks and do not have pain than it is ok to stop all together.  Plan to wean off of opioids This plan should start within one week post op of your joint replacement. Maintain the same interval or time between taking each dose and first decrease the dose.  Cut the total daily  intake of opioids by one tablet each day Next start to increase the time between doses. The last dose that should be eliminated is the evening dose.      TED hose   Complete by: As directed    Use stockings (TED hose) for three weeks on both leg(s).  You may remove them at night for sleeping.   Weight bearing as tolerated   Complete by: As directed       Allergies as of  04/20/2021       Reactions   Codeine Nausea And Vomiting        Medication List     STOP taking these medications    acetaminophen 500 MG tablet Commonly known as: TYLENOL       TAKE these medications    aspirin 325 MG EC tablet Take 1 tablet (325 mg total) by mouth 2 (two) times daily. Then take one 81 mg aspirin once a day for three weeks. Then discontinue aspirin. Notes to patient: To prevent blood clots   atenolol 50 MG tablet Commonly known as: TENORMIN Take 50 mg by mouth daily.   calcium carbonate 600 MG Tabs tablet Commonly known as: OS-CAL Take 600 mg by mouth 2 (two) times daily with a meal.   cholecalciferol 1000 units tablet Commonly known as: VITAMIN D Take 1,000 Units by mouth 2 (two) times daily.   clonazePAM 0.5 MG tablet Commonly known as: KLONOPIN Take 0.5 mg by mouth daily.   clopidogrel 75 MG tablet Commonly known as: PLAVIX Take 1 tablet (75 mg total) by mouth daily. What changed: when to take this   ezetimibe 10 MG tablet Commonly known as: ZETIA Take 10 mg by mouth at bedtime.   FISH OIL PO Take 1,000 mg by mouth 2 (two) times daily.   FLAXSEED OIL PO Take 1,000 mg by mouth 2 (two) times daily.   HYDROmorphone 2 MG tablet Commonly known as: DILAUDID Take 1-2 tablets (2-4 mg total) by mouth every 6 (six) hours as needed for severe pain. Notes to patient: May cause constipation.   May make you dizzy or drowsy. Do not drive or do anything that could be dangerous until you know how this medicine affects you   magnesium oxide 400 MG tablet Commonly known as:  MAG-OX Take 400 mg by mouth daily.   methocarbamol 500 MG tablet Commonly known as: ROBAXIN Take 1 tablet (500 mg total) by mouth every 6 (six) hours as needed for muscle spasms.   nortriptyline 25 MG capsule Commonly known as: PAMELOR Take 25 mg by mouth at bedtime.   pravastatin 80 MG tablet Commonly known as: PRAVACHOL Take 80 mg by mouth at bedtime.   SYSTANE BALANCE OP Place 1 drop into both eyes daily.   telmisartan 80 MG tablet Commonly known as: MICARDIS Take 80 mg by mouth daily.   traMADol 50 MG tablet Commonly known as: ULTRAM Take 1-2 tablets (50-100 mg total) by mouth every 6 (six) hours as needed for moderate pain. Notes to patient: May cause constipation.   May make you dizzy or drowsy. Do not drive or do anything that could be dangerous until you know how this medicine affects you   triamterene-hydrochlorothiazide 75-50 MG tablet Commonly known as: MAXZIDE Take 1 tablet by mouth daily.   vitamin E 180 MG (400 UNITS) capsule Take 400 Units by mouth 2 (two) times daily.   zolpidem 10 MG tablet Commonly known as: AMBIEN Take 10 mg by mouth at bedtime.               Discharge Care Instructions  (From admission, onward)           Start     Ordered   04/19/21 0000  Weight bearing as tolerated        04/19/21 0757   04/19/21 0000  Change dressing       Comments: You have an adhesive waterproof bandage over the incision. Leave this in place until your first  follow-up appointment. Once you remove this you will not need to place another bandage.   04/19/21 0757            Follow-up Information     Gaynelle Arabian, MD. Go on 05/01/2021.   Specialty: Orthopedic Surgery Why: You are scheduled for first post op appointment on Tuesday September 6th at 1:45pm. Contact information: [address] [place] [address] [phone]                 Signed: Theresa Duty, PA-C Orthopedic Surgery 04/23/2021, 10:35  AM

## 2021-04-23 NOTE — Progress Notes (Signed)
[facility] Summary Report / Loop Recorder 

## 2021-05-01 ENCOUNTER — Ambulatory Visit (INDEPENDENT_AMBULATORY_CARE_PROVIDER_SITE_OTHER): Payer: Medicare Other

## 2021-05-01 DIAGNOSIS — I6300 Cerebral infarction due to thrombosis of unspecified precerebral artery: Secondary | ICD-10-CM

## 2021-05-01 LAB — CUP PACEART REMOTE DEVICE CHECK
Date Time Interrogation Session: 20220902005250
Implantable Pulse Generator Implant Date: 20190319

## 2021-05-10 NOTE — Progress Notes (Signed)
[facility] Summary Report / Loop Recorder 

## 2021-05-22 DIAGNOSIS — Z471 Aftercare following joint replacement surgery: Secondary | ICD-10-CM | POA: Diagnosis not present

## 2021-05-22 DIAGNOSIS — Z96641 Presence of right artificial hip joint: Secondary | ICD-10-CM | POA: Diagnosis not present

## 2021-05-29 LAB — CUP PACEART REMOTE DEVICE CHECK
Date Time Interrogation Session: 20221003005839
Implantable Pulse Generator Implant Date: 20190319

## 2021-05-31 ENCOUNTER — Ambulatory Visit (INDEPENDENT_AMBULATORY_CARE_PROVIDER_SITE_OTHER): Payer: Medicare Other

## 2021-05-31 DIAGNOSIS — I6300 Cerebral infarction due to thrombosis of unspecified precerebral artery: Secondary | ICD-10-CM | POA: Diagnosis not present

## 2021-06-08 NOTE — Progress Notes (Signed)
[facility] Summary Report / Loop Recorder 

## 2021-06-14 ENCOUNTER — Telehealth: Payer: Self-pay

## 2021-06-14 NOTE — Telephone Encounter (Signed)
I spoke with the patient to let her know that her device has reached RRT. I told her she no longer need to be monitor at night. I will send her a return kit to her home to send the monitor back with. I let her know she can choose to leave loop in and it will not harm her at all. It is safe. I told her she can also choose to have it removed. If she wants it removed the scheduler can make her an appointment with her Doctor. She states for right now just send her the return kit. She thanked me for the call.

## 2021-06-14 NOTE — Telephone Encounter (Signed)
ILR reached RRT 06/12/21. Attempted to contact patient to advise. No answer, LMTCB.  Marked "I" in [employer]. Remotes canceled in [place].

## 2021-06-22 DIAGNOSIS — R5383 Other fatigue: Secondary | ICD-10-CM | POA: Diagnosis not present

## 2021-06-22 DIAGNOSIS — I10 Essential (primary) hypertension: Secondary | ICD-10-CM | POA: Diagnosis not present

## 2021-06-22 DIAGNOSIS — E782 Mixed hyperlipidemia: Secondary | ICD-10-CM | POA: Diagnosis not present

## 2021-06-29 DIAGNOSIS — R7303 Prediabetes: Secondary | ICD-10-CM | POA: Diagnosis not present

## 2021-06-29 DIAGNOSIS — M150 Primary generalized (osteo)arthritis: Secondary | ICD-10-CM | POA: Diagnosis not present

## 2021-06-29 DIAGNOSIS — I10 Essential (primary) hypertension: Secondary | ICD-10-CM | POA: Diagnosis not present

## 2021-06-29 DIAGNOSIS — Z79899 Other long term (current) drug therapy: Secondary | ICD-10-CM | POA: Diagnosis not present

## 2021-06-29 DIAGNOSIS — E782 Mixed hyperlipidemia: Secondary | ICD-10-CM | POA: Diagnosis not present

## 2021-06-29 DIAGNOSIS — Z0000 Encounter for general adult medical examination without abnormal findings: Secondary | ICD-10-CM | POA: Diagnosis not present

## 2021-06-29 DIAGNOSIS — F419 Anxiety disorder, unspecified: Secondary | ICD-10-CM | POA: Diagnosis not present

## 2021-12-28 DIAGNOSIS — Z79899 Other long term (current) drug therapy: Secondary | ICD-10-CM | POA: Diagnosis not present

## 2021-12-28 DIAGNOSIS — R7303 Prediabetes: Secondary | ICD-10-CM | POA: Diagnosis not present

## 2021-12-28 DIAGNOSIS — M150 Primary generalized (osteo)arthritis: Secondary | ICD-10-CM | POA: Diagnosis not present

## 2021-12-28 DIAGNOSIS — E782 Mixed hyperlipidemia: Secondary | ICD-10-CM | POA: Diagnosis not present

## 2021-12-28 DIAGNOSIS — I10 Essential (primary) hypertension: Secondary | ICD-10-CM | POA: Diagnosis not present

## 2021-12-28 DIAGNOSIS — F419 Anxiety disorder, unspecified: Secondary | ICD-10-CM | POA: Diagnosis not present

## 2022-01-18 DIAGNOSIS — I10 Essential (primary) hypertension: Secondary | ICD-10-CM | POA: Diagnosis not present

## 2022-01-18 DIAGNOSIS — M545 Low back pain, unspecified: Secondary | ICD-10-CM | POA: Diagnosis not present

## 2022-01-18 DIAGNOSIS — M25561 Pain in right knee: Secondary | ICD-10-CM | POA: Diagnosis not present

## 2022-01-18 DIAGNOSIS — R7303 Prediabetes: Secondary | ICD-10-CM | POA: Diagnosis not present

## 2022-01-18 DIAGNOSIS — E782 Mixed hyperlipidemia: Secondary | ICD-10-CM | POA: Diagnosis not present

## 2022-01-18 DIAGNOSIS — Z79899 Other long term (current) drug therapy: Secondary | ICD-10-CM | POA: Diagnosis not present

## 2022-01-18 DIAGNOSIS — M150 Primary generalized (osteo)arthritis: Secondary | ICD-10-CM | POA: Diagnosis not present

## 2022-01-18 DIAGNOSIS — F419 Anxiety disorder, unspecified: Secondary | ICD-10-CM | POA: Diagnosis not present

## 2022-01-24 DIAGNOSIS — M25551 Pain in right hip: Secondary | ICD-10-CM | POA: Diagnosis not present

## 2022-01-24 DIAGNOSIS — M5451 Vertebrogenic low back pain: Secondary | ICD-10-CM | POA: Diagnosis not present

## 2022-01-29 DIAGNOSIS — M25551 Pain in right hip: Secondary | ICD-10-CM | POA: Diagnosis not present

## 2022-01-29 DIAGNOSIS — M5451 Vertebrogenic low back pain: Secondary | ICD-10-CM | POA: Diagnosis not present

## 2022-02-01 DIAGNOSIS — M5451 Vertebrogenic low back pain: Secondary | ICD-10-CM | POA: Diagnosis not present

## 2022-02-01 DIAGNOSIS — M25551 Pain in right hip: Secondary | ICD-10-CM | POA: Diagnosis not present

## 2022-02-05 DIAGNOSIS — M25551 Pain in right hip: Secondary | ICD-10-CM | POA: Diagnosis not present

## 2022-02-05 DIAGNOSIS — M5451 Vertebrogenic low back pain: Secondary | ICD-10-CM | POA: Diagnosis not present

## 2022-02-08 DIAGNOSIS — M25551 Pain in right hip: Secondary | ICD-10-CM | POA: Diagnosis not present

## 2022-02-08 DIAGNOSIS — M5451 Vertebrogenic low back pain: Secondary | ICD-10-CM | POA: Diagnosis not present

## 2022-02-12 DIAGNOSIS — M5451 Vertebrogenic low back pain: Secondary | ICD-10-CM | POA: Diagnosis not present

## 2022-02-12 DIAGNOSIS — M25551 Pain in right hip: Secondary | ICD-10-CM | POA: Diagnosis not present

## 2022-02-15 DIAGNOSIS — M5451 Vertebrogenic low back pain: Secondary | ICD-10-CM | POA: Diagnosis not present

## 2022-02-15 DIAGNOSIS — M25551 Pain in right hip: Secondary | ICD-10-CM | POA: Diagnosis not present

## 2022-02-19 DIAGNOSIS — M25551 Pain in right hip: Secondary | ICD-10-CM | POA: Diagnosis not present

## 2022-02-19 DIAGNOSIS — M5451 Vertebrogenic low back pain: Secondary | ICD-10-CM | POA: Diagnosis not present

## 2022-02-22 DIAGNOSIS — M5451 Vertebrogenic low back pain: Secondary | ICD-10-CM | POA: Diagnosis not present

## 2022-02-22 DIAGNOSIS — M25551 Pain in right hip: Secondary | ICD-10-CM | POA: Diagnosis not present

## 2022-03-01 DIAGNOSIS — M25551 Pain in right hip: Secondary | ICD-10-CM | POA: Diagnosis not present

## 2022-03-01 DIAGNOSIS — M5451 Vertebrogenic low back pain: Secondary | ICD-10-CM | POA: Diagnosis not present

## 2022-03-05 DIAGNOSIS — M5451 Vertebrogenic low back pain: Secondary | ICD-10-CM | POA: Diagnosis not present

## 2022-03-05 DIAGNOSIS — M25551 Pain in right hip: Secondary | ICD-10-CM | POA: Diagnosis not present

## 2022-03-08 DIAGNOSIS — M25551 Pain in right hip: Secondary | ICD-10-CM | POA: Diagnosis not present

## 2022-03-08 DIAGNOSIS — M5451 Vertebrogenic low back pain: Secondary | ICD-10-CM | POA: Diagnosis not present

## 2022-03-12 DIAGNOSIS — M5451 Vertebrogenic low back pain: Secondary | ICD-10-CM | POA: Diagnosis not present

## 2022-03-12 DIAGNOSIS — M25551 Pain in right hip: Secondary | ICD-10-CM | POA: Diagnosis not present

## 2022-03-15 DIAGNOSIS — M5451 Vertebrogenic low back pain: Secondary | ICD-10-CM | POA: Diagnosis not present

## 2022-03-15 DIAGNOSIS — M25551 Pain in right hip: Secondary | ICD-10-CM | POA: Diagnosis not present

## 2022-03-19 DIAGNOSIS — M25551 Pain in right hip: Secondary | ICD-10-CM | POA: Diagnosis not present

## 2022-03-19 DIAGNOSIS — M5451 Vertebrogenic low back pain: Secondary | ICD-10-CM | POA: Diagnosis not present

## 2022-03-22 DIAGNOSIS — M25551 Pain in right hip: Secondary | ICD-10-CM | POA: Diagnosis not present

## 2022-03-22 DIAGNOSIS — M5451 Vertebrogenic low back pain: Secondary | ICD-10-CM | POA: Diagnosis not present

## 2022-03-26 DIAGNOSIS — M25551 Pain in right hip: Secondary | ICD-10-CM | POA: Diagnosis not present

## 2022-03-26 DIAGNOSIS — M5451 Vertebrogenic low back pain: Secondary | ICD-10-CM | POA: Diagnosis not present

## 2022-03-29 DIAGNOSIS — M5451 Vertebrogenic low back pain: Secondary | ICD-10-CM | POA: Diagnosis not present

## 2022-03-29 DIAGNOSIS — M25551 Pain in right hip: Secondary | ICD-10-CM | POA: Diagnosis not present

## 2022-04-11 IMAGING — DX DG PORTABLE PELVIS
1 series · 1 of 1 positions shown · non-contrast
Comparison: None.

CLINICAL DATA: Status post right total hip replacement.

EXAM:
PORTABLE PELVIS 1-2 VIEWS

[pelvis ap]
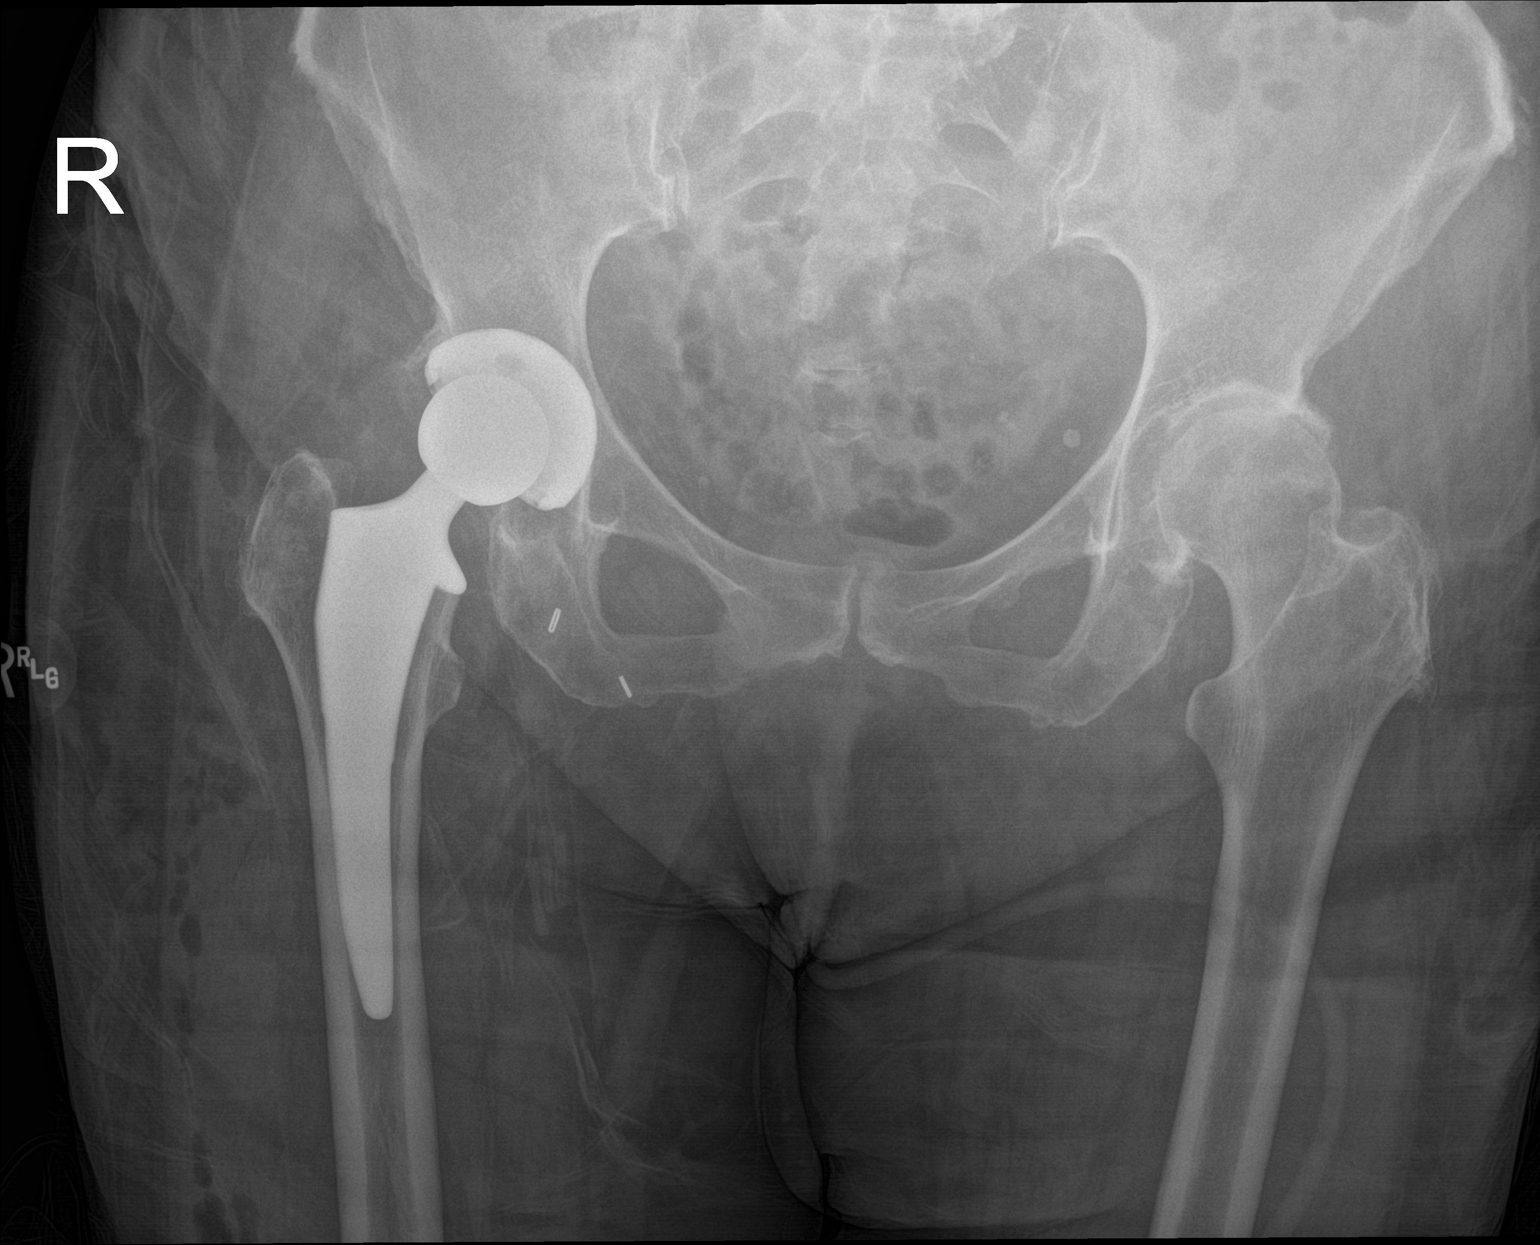

[1 of 1 positions shown; findings below may reference images not displayed]

FINDINGS: The right femoral and acetabular components are well situated.
Expected postoperative changes are seen in the surrounding soft
tissues.
IMPRESSION: Status post right total hip arthroplasty.

## 2022-04-11 IMAGING — XA DG HIP (WITH PELVIS) OPERATIVE*R*
1 series · 2 of 2 positions shown · non-contrast
Comparison: CT Abdomen and Pelvis 11/09/2018.

CLINICAL DATA: 75-year-old female undergoing hip replacement.

EXAM:
OPERATIVE RIGHT HIP (WITH PELVIS IF PERFORMED) 2 VIEWS
TECHNIQUE: Fluoroscopic spot image(s) were submitted for interpretation
post-operatively.

[Series 1: unknown protocol · 2 of 2 slices shown]
[im 1/2]
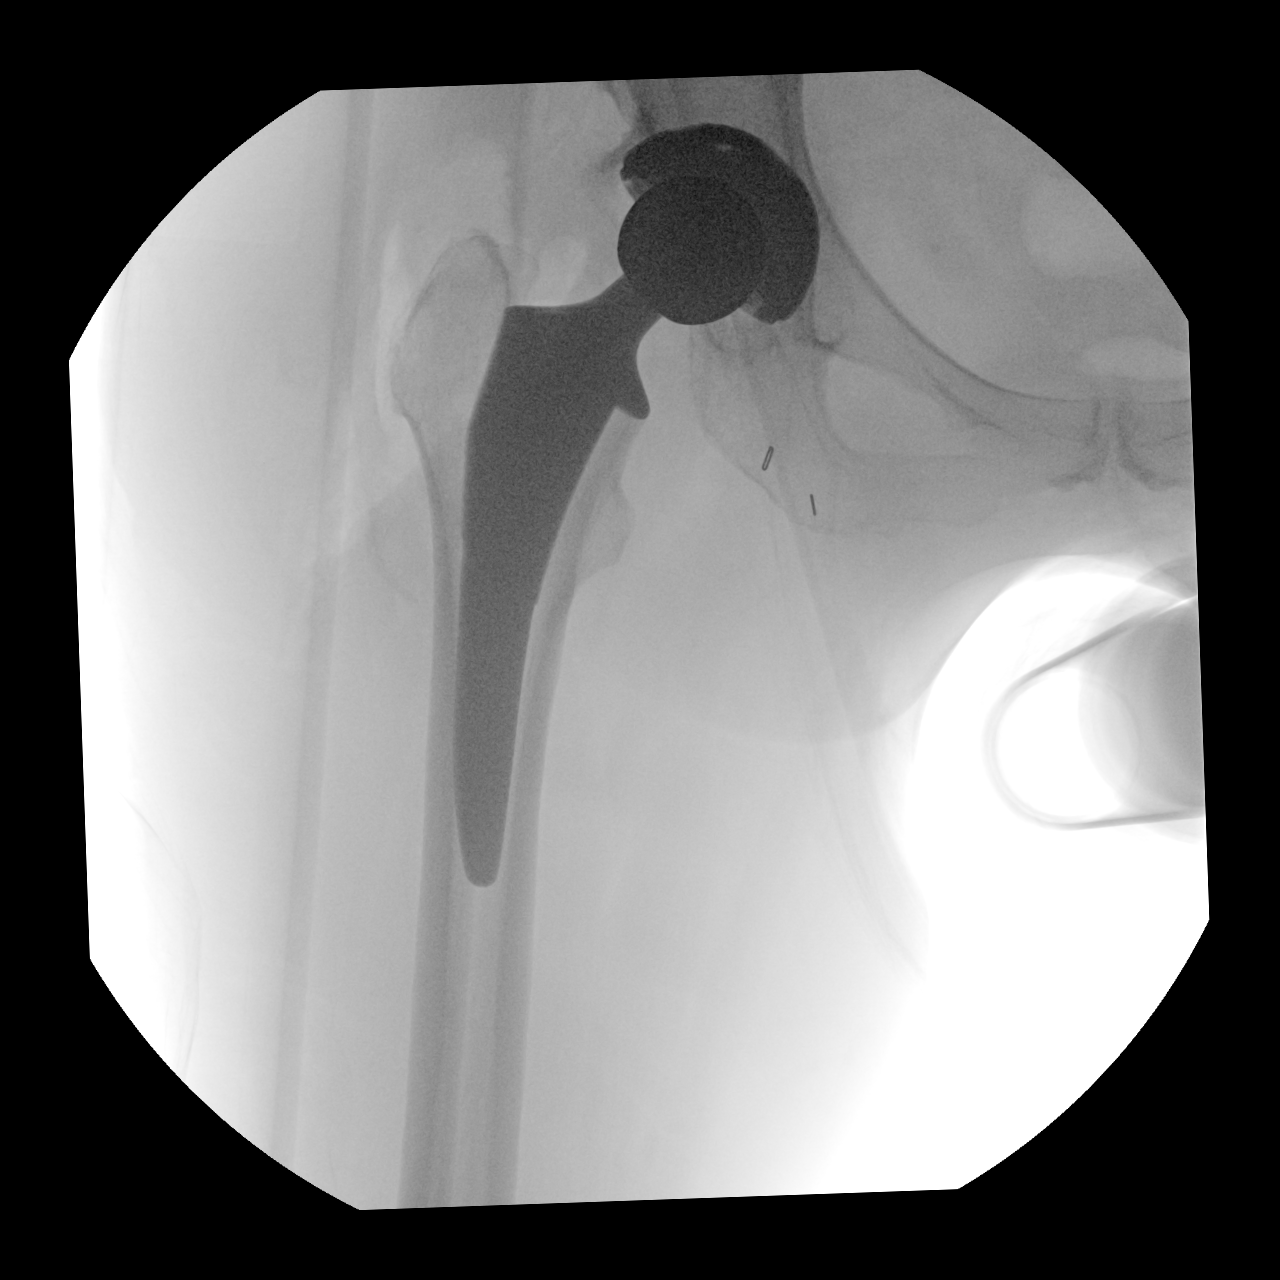
[im 2/2]
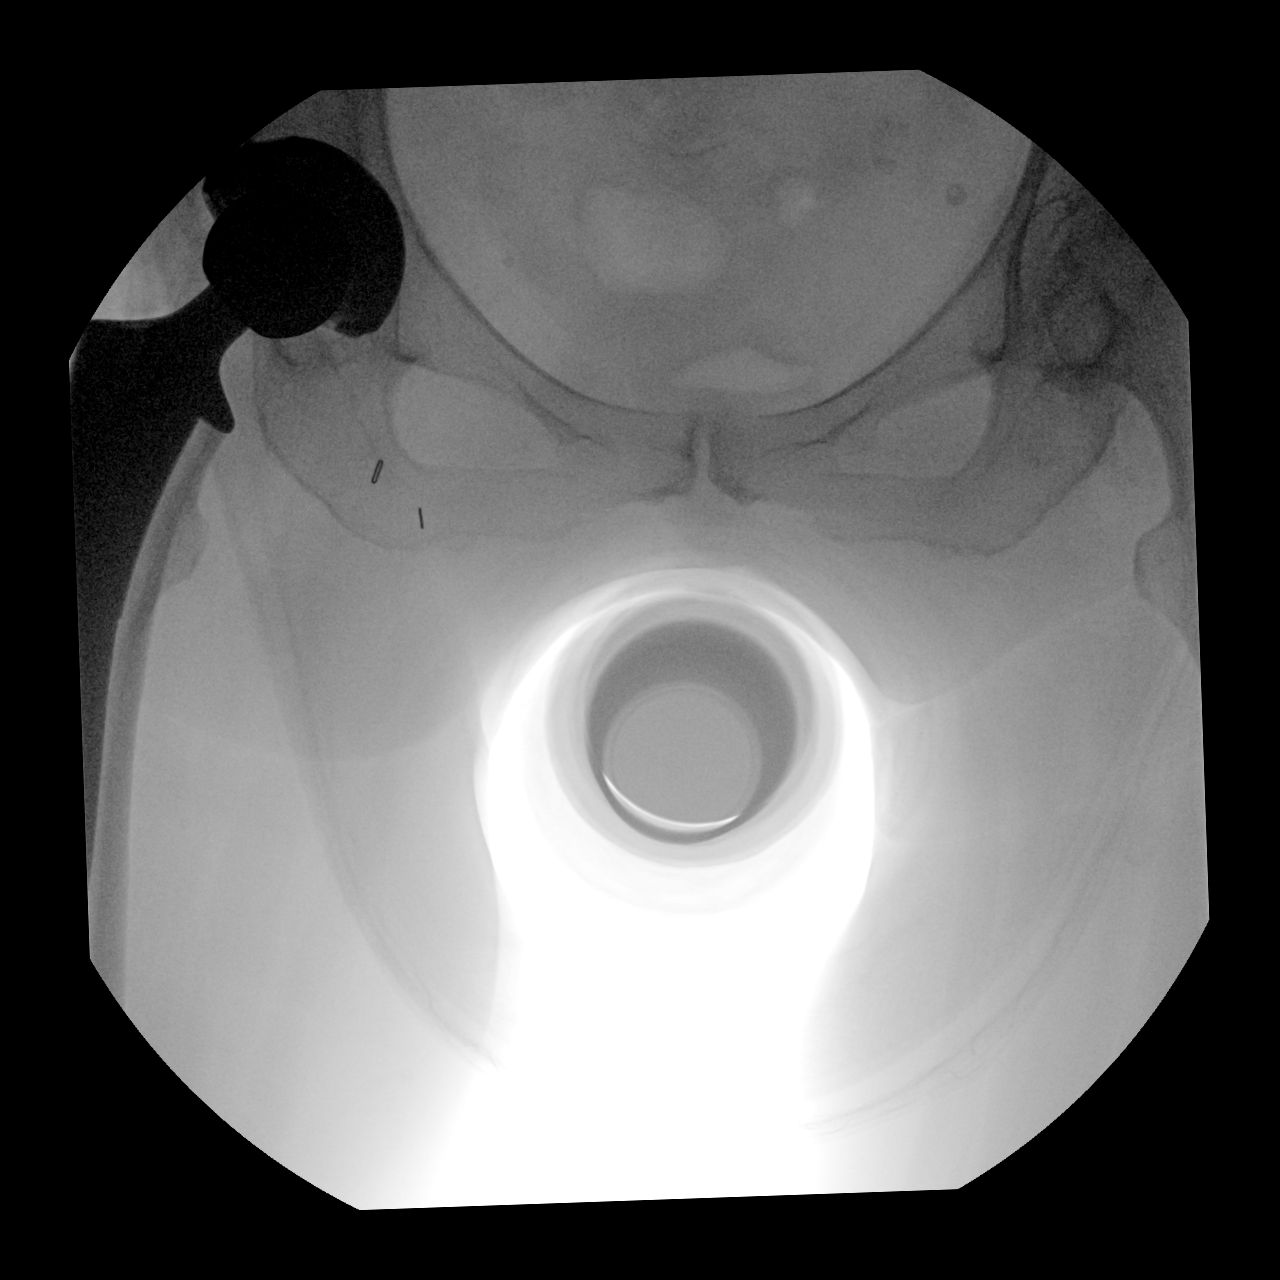

[2 of 2 positions shown; findings below may reference images not displayed]

FINDINGS: 2 intraoperative fluoroscopic AP spot views of the right hip and
lower pelvis. Bipolar hip arthroplasty hardware in place with normal
AP alignment. No unexpected osseous changes.

FLUOROSCOPY TIME:  0 minutes 4 seconds
IMPRESSION: Right bipolar hip arthroplasty with no adverse features.

## 2022-07-12 DIAGNOSIS — M150 Primary generalized (osteo)arthritis: Secondary | ICD-10-CM | POA: Diagnosis not present

## 2022-07-12 DIAGNOSIS — R5383 Other fatigue: Secondary | ICD-10-CM | POA: Diagnosis not present

## 2022-07-12 DIAGNOSIS — R7303 Prediabetes: Secondary | ICD-10-CM | POA: Diagnosis not present

## 2022-07-12 DIAGNOSIS — E782 Mixed hyperlipidemia: Secondary | ICD-10-CM | POA: Diagnosis not present

## 2022-07-12 DIAGNOSIS — I10 Essential (primary) hypertension: Secondary | ICD-10-CM | POA: Diagnosis not present

## 2022-07-12 DIAGNOSIS — Z79899 Other long term (current) drug therapy: Secondary | ICD-10-CM | POA: Diagnosis not present

## 2022-07-12 DIAGNOSIS — F419 Anxiety disorder, unspecified: Secondary | ICD-10-CM | POA: Diagnosis not present

## 2022-07-25 DIAGNOSIS — Z79899 Other long term (current) drug therapy: Secondary | ICD-10-CM | POA: Diagnosis not present

## 2022-07-25 DIAGNOSIS — E782 Mixed hyperlipidemia: Secondary | ICD-10-CM | POA: Diagnosis not present

## 2022-07-25 DIAGNOSIS — J320 Chronic maxillary sinusitis: Secondary | ICD-10-CM | POA: Diagnosis not present

## 2022-07-25 DIAGNOSIS — R7303 Prediabetes: Secondary | ICD-10-CM | POA: Diagnosis not present

## 2022-07-25 DIAGNOSIS — F419 Anxiety disorder, unspecified: Secondary | ICD-10-CM | POA: Diagnosis not present

## 2022-07-25 DIAGNOSIS — I10 Essential (primary) hypertension: Secondary | ICD-10-CM | POA: Diagnosis not present

## 2022-07-25 DIAGNOSIS — N182 Chronic kidney disease, stage 2 (mild): Secondary | ICD-10-CM | POA: Diagnosis not present

## 2022-07-25 DIAGNOSIS — M150 Primary generalized (osteo)arthritis: Secondary | ICD-10-CM | POA: Diagnosis not present

## 2022-07-25 DIAGNOSIS — Z0000 Encounter for general adult medical examination without abnormal findings: Secondary | ICD-10-CM | POA: Diagnosis not present

## 2023-02-06 DIAGNOSIS — R7303 Prediabetes: Secondary | ICD-10-CM | POA: Diagnosis not present

## 2023-02-06 DIAGNOSIS — E782 Mixed hyperlipidemia: Secondary | ICD-10-CM | POA: Diagnosis not present

## 2023-02-06 DIAGNOSIS — Z79899 Other long term (current) drug therapy: Secondary | ICD-10-CM | POA: Diagnosis not present

## 2023-02-06 DIAGNOSIS — I10 Essential (primary) hypertension: Secondary | ICD-10-CM | POA: Diagnosis not present

## 2023-02-20 DIAGNOSIS — Z79899 Other long term (current) drug therapy: Secondary | ICD-10-CM | POA: Diagnosis not present

## 2023-02-20 DIAGNOSIS — I10 Essential (primary) hypertension: Secondary | ICD-10-CM | POA: Diagnosis not present

## 2023-02-20 DIAGNOSIS — N182 Chronic kidney disease, stage 2 (mild): Secondary | ICD-10-CM | POA: Diagnosis not present

## 2023-02-20 DIAGNOSIS — F419 Anxiety disorder, unspecified: Secondary | ICD-10-CM | POA: Diagnosis not present

## 2023-02-20 DIAGNOSIS — M150 Primary generalized (osteo)arthritis: Secondary | ICD-10-CM | POA: Diagnosis not present

## 2023-02-20 DIAGNOSIS — R7303 Prediabetes: Secondary | ICD-10-CM | POA: Diagnosis not present

## 2023-02-20 DIAGNOSIS — E782 Mixed hyperlipidemia: Secondary | ICD-10-CM | POA: Diagnosis not present

## 2023-03-03 DIAGNOSIS — M1612 Unilateral primary osteoarthritis, left hip: Secondary | ICD-10-CM | POA: Diagnosis not present

## 2023-03-03 DIAGNOSIS — Z96641 Presence of right artificial hip joint: Secondary | ICD-10-CM | POA: Diagnosis not present

## 2023-03-27 DIAGNOSIS — Z01818 Encounter for other preprocedural examination: Secondary | ICD-10-CM | POA: Diagnosis not present

## 2023-03-31 DIAGNOSIS — I10 Essential (primary) hypertension: Secondary | ICD-10-CM | POA: Diagnosis not present

## 2023-03-31 DIAGNOSIS — N182 Chronic kidney disease, stage 2 (mild): Secondary | ICD-10-CM | POA: Diagnosis not present

## 2023-03-31 DIAGNOSIS — R7303 Prediabetes: Secondary | ICD-10-CM | POA: Diagnosis not present

## 2023-03-31 DIAGNOSIS — Z01818 Encounter for other preprocedural examination: Secondary | ICD-10-CM | POA: Diagnosis not present

## 2023-03-31 DIAGNOSIS — E782 Mixed hyperlipidemia: Secondary | ICD-10-CM | POA: Diagnosis not present

## 2023-04-09 DIAGNOSIS — H35361 Drusen (degenerative) of macula, right eye: Secondary | ICD-10-CM | POA: Diagnosis not present

## 2023-04-16 NOTE — Progress Notes (Signed)
COVID Vaccine received:  [x]  No []  Yes Date of any COVID positive Test in last 90 days:  None  PCP - Georgianne Fick, MD  medical clearance in 03-31-2023  note in [facility]   notes and EKG on chart  Cardiologist - Will Elberta Fortis, MD  Chest x-ray - 12-04-2017  2v  Epic EKG - (12-05-2017) 03-31-2023   on chart  ECHO -  Cardiac Cath -   PCR screen: [x]  Ordered & Completed           []   No Order but Needs PROFEND           []   N/A for this surgery  Surgery Plan:  []  Ambulatory                            [x]  Outpatient in bed                            []  Admit  Anesthesia:    []  General  [x]  Spinal                           []   Choice []   MAC  Pacemaker / ICD device [x]  No []  Yes   Spinal Cord Stimulator:[x]  No []  Yes    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  Last checked 05-2021, EOL now.       History of Sleep Apnea? [x]  No []  Yes   CPAP used?- [x]  No []  Yes    Does the patient monitor blood sugar?          [x]  No []  Yes  []  N/A Last A1c was 6.2  on 03-30-2023  Patient has: []  NO Hx DM   [x]  Pre-DM     Diet control only              []  DM1  []   DM2  Blood Thinner / Instructions:  Plavix  Instructions by Ortho, appt tomorrow w/ PA Aspirin Instructions:  none  ERAS Protocol Ordered: []  No  [x]  Yes PRE-SURGERY []  ENSURE  [x]  G2  Patient is to be NPO after: 08:15  Comments: Patient was given the 5 CHG shower / bath instructions for THA  arthroplasty surgery along with 2 bottles of the CHG soap. Patient will start this on: Friday  04-25-2023  All questions were asked and answered, Patient voiced understanding of this process.   Activity level: Patient is unable to climb a flight of stairs without difficulty; [x]  No CP  [x]  No SOB, but would have back andleg pain.  Patient can perform ADLs without assistance.   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  Patient verbalized understanding and agreement to the Pre-Surgical Instructions that were given to them at this PAT appointment. Patient was also educated of the need to review these PAT instructions again prior to her surgery.I reviewed the appropriate phone numbers to call if they have any and questions or concerns.

## 2023-04-16 NOTE — Patient Instructions (Signed)
SURGICAL WAITING ROOM VISITATION Patients having surgery or a procedure may have no more than 2 support people in the waiting area - these visitors may rotate in the visitor waiting room.   Due to an increase in RSV and influenza rates and associated hospitalizations, children ages 53 and under may not visit patients in [facility] hospitals. If the patient needs to stay at the hospital during part of their recovery, the visitor guidelines for inpatient rooms apply.  PRE-OP VISITATION  Pre-op nurse will coordinate an appropriate time for 1 support person to accompany the patient in pre-op.  This support person may not rotate.  This visitor will be contacted when the time is appropriate for the visitor to come back in the pre-op area.  Please refer to the [facility] website for the visitor guidelines for Inpatients (after your surgery is over and you are in a regular room).  You are not required to quarantine at this time prior to your surgery. However, you must do this: Hand Hygiene often Do NOT share personal items Notify your provider if you are in close contact with someone who has COVID or you develop fever 100.4 or greater, new onset of sneezing, cough, sore throat, shortness of breath or body aches.  If you test positive for Covid or have been in contact with anyone that has tested positive in the last 10 days please notify you surgeon.    Your procedure is scheduled on:  Tuesday  September  Report to [facility] Main Entrance: [place] entrance where the [employer] is available.   Report to admitting at:  08:45   AM  Call this number if you have any questions or problems the morning of surgery [phone]  Do not eat food after Midnight the night prior to your surgery/procedure.  After Midnight you may have the following liquids until  08:15  AM  DAY OF SURGERY  Clear Liquid Diet Water Black Coffee (sugar ok, NO MILK/CREAM OR CREAMERS)  Tea (sugar ok, NO  MILK/CREAM OR CREAMERS) regular and decaf                             Plain Jell-O  with no fruit (NO RED)                                           Fruit ices (not with fruit pulp, NO RED)                                     Popsicles (NO RED)                                                                  Juice: NO CITRUS JUICES: only apple, WHITE grape, WHITE cranberry Sports drinks like Gatorade or Powerade (NO RED)                    The day of surgery:  Drink ONE (1) Pre-Surgery  G2 at  08:45    AM the  morning of surgery. Drink in one sitting. Do not sip.  This drink was given to you during your hospital pre-op appointment visit. Nothing else to drink after completing the Pre-Surgery G2 : No candy, chewing gum or throat lozenges.    FOLLOW  ANY ADDITIONAL PRE OP INSTRUCTIONS YOU RECEIVED FROM YOUR SURGEON'S OFFICE!!!   Oral Hygiene is also important to reduce your risk of infection.        Remember - BRUSH YOUR TEETH THE MORNING OF SURGERY WITH YOUR REGULAR TOOTHPASTE  Do NOT smoke after Midnight the night before surgery.  STOP TAKING all Vitamins, Herbs and supplements 1 week before your surgery.   Take ONLY these medicines the morning of surgery with A SIP OF WATER: Atenolol.  You may taker Clonazepam if needed for anxiety.  You may use your Eye drops if needed.    You may not have any metal on your body including hair pins, jewelry, and body piercing  Do not wear make-up, lotions, powders, perfumes or deodorant  Do not wear nail polish including gel and S&S, artificial / acrylic nails, or any other type of covering on natural nails including finger and toenails. If you have artificial nails, gel coating, etc., that needs to be removed by a nail salon, Please have this removed prior to surgery. Not doing so may mean that your surgery could be cancelled or delayed if the Surgeon or anesthesia staff feels like they are unable to monitor you safely.   Do not shave 48 hours  prior to surgery to avoid nicks in your skin which may contribute to postoperative infections.    Contacts, Hearing Aids, dentures or bridgework may not be worn into surgery. DENTURES WILL BE REMOVED PRIOR TO SURGERY PLEASE DO NOT APPLY "Poly grip" OR ADHESIVES!!!  You may bring a small overnight bag with you on the day of surgery, only pack items that are not valuable. [facility] IS NOT RESPONSIBLE   FOR VALUABLES THAT ARE LOST OR STOLEN.    Do not bring your home medications to the hospital. The Pharmacy will dispense medications listed on your medication list to you during your admission in the Hospital.  Special Instructions: Bring a copy of your healthcare power of attorney and living will documents the day of surgery, if you wish to have them scanned into your [facility] Records- EPIC  Please read over the following fact sheets you were given: IF YOU HAVE QUESTIONS ABOUT YOUR PRE-OP INSTRUCTIONS, PLEASE CALL [phone].     Pre-operative 5 CHG Bath Instructions   You can play a key role in reducing the risk of infection after surgery. Your skin needs to be as free of germs as possible. You can reduce the number of germs on your skin by washing with CHG (chlorhexidine gluconate) soap before surgery. CHG is an antiseptic soap that kills germs and continues to kill germs even after washing.   DO NOT use if you have an allergy to chlorhexidine/CHG or antibacterial soaps. If your skin becomes reddened or irritated, stop using the CHG and notify one of our RNs at [phone]  Please shower with the CHG soap starting 4 days before surgery using the following schedule: START SHOWERS ON   FRIDAY   April 25, 2023  Please keep in mind the following:  DO NOT shave, including legs and underarms,  starting the day of your first shower.   You may shave your face at any point before/day of surgery.   Place clean sheets on your bed the day you start using CHG soap. Use a clean washcloth (not used since being washed) for each shower. DO NOT sleep with pets once you start using the CHG.   CHG Shower Instructions:  If you choose to wash your hair and private area, wash first with your normal shampoo/soap.  After you use shampoo/soap, rinse your hair and body thoroughly to remove shampoo/soap residue.  Turn the water OFF and apply about 3 tablespoons (45 ml) of CHG soap to a CLEAN washcloth.  Apply CHG soap ONLY FROM YOUR NECK DOWN TO YOUR TOES (washing for 3-5 minutes)  DO NOT use CHG soap on face, private areas, open wounds, or sores.  Pay special attention to the area where your surgery is being performed.  If you are having back surgery, having someone wash your back for you may be helpful.  Wait 2 minutes after CHG soap is applied, then you may rinse off the CHG soap.  Pat dry with a clean towel  Put on clean clothes/pajamas   If you choose to wear lotion, please use ONLY the CHG-compatible lotions on the back of this paper.     Additional instructions for the day of surgery: DO NOT APPLY any lotions, deodorants, cologne, or perfumes.   Put on clean/comfortable clothes.  Brush your teeth.  Ask your nurse before applying any prescription medications to the skin.      CHG Compatible Lotions   Aveeno Moisturizing lotion  Cetaphil Moisturizing Cream  Cetaphil Moisturizing Lotion  Clairol Herbal Essence Moisturizing Lotion, Dry Skin  Clairol Herbal Essence Moisturizing Lotion, Extra Dry Skin  Clairol Herbal Essence Moisturizing Lotion, Normal Skin  Curel Age Defying Therapeutic Moisturizing Lotion with Alpha Hydroxy  Curel Extreme Care Body Lotion  Curel Soothing Hands Moisturizing Hand Lotion  Curel Therapeutic Moisturizing Cream, Fragrance-Free  Curel Therapeutic  Moisturizing Lotion, Fragrance-Free  Curel Therapeutic Moisturizing Lotion, Original Formula  Eucerin Daily Replenishing Lotion  Eucerin Dry Skin Therapy Plus Alpha Hydroxy Crme  Eucerin Dry Skin Therapy Plus Alpha Hydroxy Lotion  Eucerin Original Crme  Eucerin Original Lotion  Eucerin Plus Crme Eucerin Plus Lotion  Eucerin TriLipid Replenishing Lotion  Keri Anti-Bacterial Hand Lotion  Keri Deep Conditioning Original Lotion Dry Skin Formula Softly Scented  Keri Deep Conditioning Original Lotion, Fragrance Free Sensitive Skin Formula  Keri Lotion Fast Absorbing Fragrance Free Sensitive Skin Formula  Keri Lotion Fast Absorbing Softly Scented Dry Skin Formula  Keri Original Lotion  Keri Skin Renewal Lotion Keri Silky Smooth Lotion  Keri Silky Smooth Sensitive Skin Lotion  Nivea Body Creamy Conditioning Oil  Nivea Body Extra Enriched Lotion  Nivea Body Original Lotion  Nivea Body Sheer Moisturizing Lotion Nivea Crme  Nivea Skin Firming Lotion  NutraDerm 30 Skin Lotion  NutraDerm Skin Lotion  NutraDerm Therapeutic Skin Cream  NutraDerm Therapeutic Skin Lotion  ProShield Protective Hand Cream  Provon moisturizing lotion   FAILURE TO FOLLOW THESE INSTRUCTIONS MAY RESULT IN THE CANCELLATION OF YOUR SURGERY  PATIENT SIGNATURE_________________________________  NURSE SIGNATURE__________________________________  ________________________________________________________________________       Cassandra Bailey    An incentive spirometer is a tool that can help keep your lungs clear and active. This tool measures how well you are filling your lungs with each breath. Taking  long deep breaths may help reverse or decrease the chance of developing breathing (pulmonary) problems (especially infection) following: A long period of time when you are unable to move or be active. BEFORE THE PROCEDURE  If the spirometer includes an indicator to show your best effort, your nurse or  respiratory therapist will set it to a desired goal. If possible, sit up straight or lean slightly forward. Try not to slouch. Hold the incentive spirometer in an upright position. INSTRUCTIONS FOR USE  Sit on the edge of your bed if possible, or sit up as far as you can in bed or on a chair. Hold the incentive spirometer in an upright position. Breathe out normally. Place the mouthpiece in your mouth and seal your lips tightly around it. Breathe in slowly and as deeply as possible, raising the piston or the ball toward the top of the column. Hold your breath for 3-5 seconds or for as long as possible. Allow the piston or ball to fall to the bottom of the column. Remove the mouthpiece from your mouth and breathe out normally. Rest for a few seconds and repeat Steps 1 through 7 at least 10 times every 1-2 hours when you are awake. Take your time and take a few normal breaths between deep breaths. The spirometer may include an indicator to show your best effort. Use the indicator as a goal to work toward during each repetition. After each set of 10 deep breaths, practice coughing to be sure your lungs are clear. If you have an incision (the cut made at the time of surgery), support your incision when coughing by placing a pillow or rolled up towels firmly against it. Once you are able to get out of bed, walk around indoors and cough well. You may stop using the incentive spirometer when instructed by your caregiver.  RISKS AND COMPLICATIONS Take your time so you do not get dizzy or light-headed. If you are in pain, you may need to take or ask for pain medication before doing incentive spirometry. It is harder to take a deep breath if you are having pain. AFTER USE Rest and breathe slowly and easily. It can be helpful to keep track of a log of your progress. Your caregiver can provide you with a simple table to help with this. If you are using the spirometer at home, follow these  instructions: SEEK MEDICAL CARE IF:  You are having difficultly using the spirometer. You have trouble using the spirometer as often as instructed. Your pain medication is not giving enough relief while using the spirometer. You develop fever of 100.5 F (38.1 C) or higher.                                                                                                    SEEK IMMEDIATE MEDICAL CARE IF:  You cough up bloody sputum that had not been present before. You develop fever of 102 F (38.9 C) or greater. You develop worsening pain at or near the incision site. MAKE SURE YOU:  Understand these instructions. Will watch your condition.  Will get help right away if you are not doing well or get worse. Document Released: 12/23/2006 Document Revised: 11/04/2011 Document Reviewed: 02/23/2007 [facility] Patient Information 2014 [place], [place].     WHAT IS A BLOOD TRANSFUSION? Blood Transfusion Information  A transfusion is the replacement of blood or some of its parts. Blood is made up of multiple cells which provide different functions. Red blood cells carry oxygen and are used for blood loss replacement. White blood cells fight against infection. Platelets control bleeding. Plasma helps clot blood. Other blood products are available for specialized needs, such as hemophilia or other clotting disorders. BEFORE THE TRANSFUSION  Who gives blood for transfusions?  Healthy volunteers who are fully evaluated to make sure their blood is safe. This is blood bank blood. Transfusion therapy is the safest it has ever been in the practice of medicine. Before blood is taken from a donor, a complete history is taken to make sure that person has no history of diseases nor engages in risky social behavior (examples are intravenous drug use or sexual activity with multiple partners). The donor's travel history is screened to minimize risk of transmitting infections, such as malaria. The donated blood  is tested for signs of infectious diseases, such as HIV and hepatitis. The blood is then tested to be sure it is compatible with you in order to minimize the chance of a transfusion reaction. If you or a relative donates blood, this is often done in anticipation of surgery and is not appropriate for emergency situations. It takes many days to process the donated blood. RISKS AND COMPLICATIONS Although transfusion therapy is very safe and saves many lives, the main dangers of transfusion include:  Getting an infectious disease. Developing a transfusion reaction. This is an allergic reaction to something in the blood you were given. Every precaution is taken to prevent this. The decision to have a blood transfusion has been considered carefully by your caregiver before blood is given. Blood is not given unless the benefits outweigh the risks. AFTER THE TRANSFUSION Right after receiving a blood transfusion, you will usually feel much better and more energetic. This is especially true if your red blood cells have gotten low (anemic). The transfusion raises the level of the red blood cells which carry oxygen, and this usually causes an energy increase. The nurse administering the transfusion will monitor you carefully for complications. HOME CARE INSTRUCTIONS  No special instructions are needed after a transfusion. You may find your energy is better. Speak with your caregiver about any limitations on activity for underlying diseases you may have. SEEK MEDICAL CARE IF:  Your condition is not improving after your transfusion. You develop redness or irritation at the intravenous (IV) site. SEEK IMMEDIATE MEDICAL CARE IF:  Any of the following symptoms occur over the next 12 hours: Shaking chills. You have a temperature by mouth above 102 F (38.9 C), not controlled by medicine. Chest, back, or muscle pain. People around you feel you are not acting correctly or are confused. Shortness of breath or  difficulty breathing. Dizziness and fainting. You get a rash or develop hives. You have a decrease in urine output. Your urine turns a dark color or changes to pink, red, or brown. Any of the following symptoms occur over the next 10 days: You have a temperature by mouth above 102 F (38.9 C), not controlled by medicine. Shortness of breath. Weakness after normal activity. The white part of the eye turns yellow (jaundice). You have a decrease  in the amount of urine or are urinating less often. Your urine turns a dark color or changes to pink, red, or brown. Document Released: 08/09/2000 Document Revised: 11/04/2011 Document Reviewed: 03/28/2008 [facility] Patient Information 2014 [place], [place].  _______________________________________________________________________

## 2023-04-17 ENCOUNTER — Encounter (HOSPITAL_COMMUNITY): Payer: Self-pay

## 2023-04-17 ENCOUNTER — Other Ambulatory Visit: Payer: Self-pay

## 2023-04-17 ENCOUNTER — Encounter (HOSPITAL_COMMUNITY)
Admission: RE | Admit: 2023-04-17 | Discharge: 2023-04-17 | Disposition: A | Discharge status: Home / Self Care | Payer: Medicare Other | Source: Ambulatory Visit | Attending: Orthopedic Surgery | Admitting: Orthopedic Surgery

## 2023-04-17 VITALS — BP 110/70 | HR 62 | Temp 98.3°F | Resp 16 | Ht 68.0 in | Wt 185.0 lb

## 2023-04-17 DIAGNOSIS — M1612 Unilateral primary osteoarthritis, left hip: Secondary | ICD-10-CM

## 2023-04-17 DIAGNOSIS — I10 Essential (primary) hypertension: Secondary | ICD-10-CM | POA: Diagnosis not present

## 2023-04-17 DIAGNOSIS — Z01818 Encounter for other preprocedural examination: Secondary | ICD-10-CM | POA: Insufficient documentation

## 2023-04-17 HISTORY — DX: Prediabetes: R73.03

## 2023-04-17 HISTORY — DX: Anxiety disorder, unspecified: F41.9

## 2023-04-17 HISTORY — DX: Headache, unspecified: R51.9

## 2023-04-17 HISTORY — DX: Chronic kidney disease, unspecified: N18.9

## 2023-04-17 LAB — CBC
HCT: 41.1 % (ref 36.0–46.0)
Hemoglobin: 13.4 g/dL (ref 12.0–15.0)
MCH: 30.5 pg (ref 26.0–34.0)
MCHC: 32.6 g/dL (ref 30.0–36.0)
MCV: 93.4 fL (ref 80.0–100.0)
Platelets: 282 10*3/uL (ref 150–400)
RBC: 4.4 MIL/uL (ref 3.87–5.11)
RDW: 13.6 % (ref 11.5–15.5)
WBC: 5.4 10*3/uL (ref 4.0–10.5)
nRBC: 0 % (ref 0.0–0.2)

## 2023-04-17 LAB — BASIC METABOLIC PANEL
Anion gap: 9 (ref 5–15)
BUN: 15 mg/dL (ref 8–23)
CO2: 28 mmol/L (ref 22–32)
Calcium: 9.5 mg/dL (ref 8.9–10.3)
Chloride: 102 mmol/L (ref 98–111)
Creatinine, Ser: 0.73 mg/dL (ref 0.44–1.00)
GFR, Estimated: >60 mL/min (ref >60)
Glucose, Bld: 118 mg/dL — ABNORMAL HIGH (ref 70–99)
Potassium: 3.8 mmol/L (ref 3.5–5.1)
Sodium: 139 mmol/L (ref 135–145)

## 2023-04-17 LAB — TYPE AND SCREEN
ABO/RH(D): A POS
Antibody Screen: NEGATIVE

## 2023-04-17 LAB — SURGICAL PCR SCREEN
MRSA, PCR: NEGATIVE
Staphylococcus aureus: NEGATIVE

## 2023-04-18 NOTE — Progress Notes (Incomplete)
Case: [id number] Date/Time: 04/29/23 1100   Procedure: TOTAL HIP ARTHROPLASTY ANTERIOR APPROACH (Left: Hip)   Anesthesia type: Spinal   Pre-op diagnosis: Left hip osteoarthritis   Location: [facility] ROOM 10 / [facility]   Surgeons: Durene Romans, MD       DISCUSSION: Cassandra Bailey is a 77 yo female who presents to PAT prior to surgery above. PMH significant for HTN, CAD, hx of CVA (in 2019), s/p loop recorder insertion (10/2017), pre-diabetes (HgA1c 6.2), arthritis s/p right THA (03/2021), CKD stage 2, anxiety.  Patient had a CVA in March 2019. Presented with acute dizziness. CT head showed decreased attenuation in the left medial pons-midbrain. MRI brain reviewed and showed patchy small volume acute ischemic nonhemorrhagic infarcts involving the inferior right cerebellar hemisphere, right PICA territory. MRA head negative for intracranial stenosis. Carotid doppler unremarkable. 2D echo unremarkable for clot and normal EF. TEE negative for thrombus and PFO. Loop recorder placed on 11/11/17. No arrhythmias ever detected. Followed up with Neurology regularly. Advised she can follow up prn.  Patient follows with her PCP for chronic medical conditions. Last seen on 03/31/23. Clearance signed that patient is optimized and low risk for surgery (paper copy in chart).   No Plavix hold instructions were given. Called Dr. Nilsa Nutting office and discussed with Cordelia Pen who will get in touch with her PCP's office who prescribes it and ask for 7 day hold. She will also get in touch with patient.  VS: BP 110/70 Comment: right arm sitting  Pulse 62   Temp 36.8 C (Oral)   Resp 16   Ht 5\' 8"  (1.727 m)   Wt 83.9 kg   SpO2 97%   BMI 28.13 kg/m   PROVIDERS: Georgianne Fick, MD   LABS: Labs reviewed: Acceptable for surgery. (all labs ordered are listed, but only abnormal results are displayed)  Labs Reviewed  BASIC METABOLIC PANEL - Abnormal; Notable for the following components:      Result Value   Glucose, Bld 118  (*)    All other components within normal limits  SURGICAL PCR SCREEN  CBC  TYPE AND SCREEN     IMAGES:   EKG 03/31/23 (paper copy in chart)  SR, rate 70  Low voltage   CV:  TEE 11/11/17:  - Left ventricle: The cavity size was normal. Wall thickness was normal. Systolic function was normal.  - Aortic valve: No evidence of vegetation. There was mild regurgitation.  - Mitral valve: No evidence of vegetation.  - Left atrium: No evidence of thrombus in the atrial cavity or appendage.  - Atrial septum: No defect or patent foramen ovale was identified.   Echo 11/10/17 - Left ventricle: The cavity size was normal. There was mild concentric hypertrophy. Systolic function was vigorous. The estimated ejection fraction was in the range of 65% to 70%. Wall motion was normal; there were no regional wall motion abnormalities. Doppler parameters are consistent with abnormal left ventricular relaxation (grade 1 diastolic dysfunction). There was no evidence of elevated ventricular filling pressure by Doppler parameters.  - Aortic valve: There was no regurgitation.  - Mitral valve: Valve area by pressure half-time: 1.69 cm^2.  - Right ventricle: The cavity size was normal. Wall thickness was normal. Systolic function was normal.  - Right atrium: The atrium was normal in size.  - Tricuspid valve: There was trivial regurgitation.  - Pulmonary arteries: Systolic pressure was within the normal range.  - Inferior vena cava: The vessel was normal in size.  - Pericardium, extracardiac:  There was no pericardial effusion.  - Impressions: No cardiac source of emboli was indentified.    Carotid duplex 11/10/17:  - Left Carotid: Velocities in the left ICA are consistent with a 1-39% stenosis.  - Vertebrals: Bilateral vertebral arteries demonstrate antegrade flow.   Past Medical History:  Diagnosis Date   Anxiety    Arthritis    hips, knee, back   Chronic kidney disease    CKD2   Coronary artery disease     Headache    History of kidney stones    Hypertension    Pre-diabetes    Stroke (HCC) 11/09/2017    Past Surgical History:  Procedure Laterality Date   LOOP RECORDER INSERTION N/A 11/11/2017   Procedure: LOOP RECORDER INSERTION;  Surgeon: Regan Lemming, MD;  Location: [facility] INVASIVE CV LAB;  Service: Cardiovascular;  Laterality: N/A;   TEE WITHOUT CARDIOVERSION N/A 11/11/2017   Procedure: TRANSESOPHAGEAL ECHOCARDIOGRAM (TEE) with loop;  Surgeon: Elease Hashimoto Deloris Ping, MD;  Location: [facility] ENDOSCOPY;  Service: Cardiovascular;  Laterality: N/A;   TOTAL HIP ARTHROPLASTY Right 04/18/2021   Procedure: TOTAL HIP ARTHROPLASTY ANTERIOR APPROACH;  Surgeon: Ollen Gross, MD;  Location: [facility];  Service: Orthopedics;  Laterality: Right;   TUBAL LIGATION  11/1977   VARICOSE VEIN SURGERY Right 1979    MEDICATIONS:  aspirin EC 325 MG EC tablet   atenolol (TENORMIN) 50 MG tablet   calcium carbonate (OS-CAL) 600 MG TABS tablet   celecoxib (CELEBREX) 200 MG capsule   cholecalciferol (VITAMIN D) 1000 units tablet   clonazePAM (KLONOPIN) 0.5 MG tablet   clopidogrel (PLAVIX) 75 MG tablet   ezetimibe (ZETIA) 10 MG tablet   Flaxseed, Linseed, (FLAXSEED OIL) 1000 MG CAPS   HYDROmorphone (DILAUDID) 2 MG tablet   magnesium oxide (MAG-OX) 400 MG tablet   methocarbamol (ROBAXIN) 500 MG tablet   nortriptyline (PAMELOR) 25 MG capsule   Omega-3 Fatty Acids (FISH OIL PO)   Propylene Glycol (SYSTANE BALANCE OP)   telmisartan (MICARDIS) 80 MG tablet   traMADol (ULTRAM) 50 MG tablet   triamterene-hydrochlorothiazide (MAXZIDE) 75-50 MG tablet   vitamin E 180 MG (400 UNITS) capsule   zolpidem (AMBIEN) 10 MG tablet   No current facility-administered medications for this encounter.   [name] [facility] Surgical Short Stay/Anesthesiology [facility] Phone [phone] 04/22/2023 11:13 AM

## 2023-04-22 ENCOUNTER — Encounter (HOSPITAL_COMMUNITY): Payer: Self-pay

## 2023-04-22 NOTE — Anesthesia Preprocedure Evaluation (Addendum)
Anesthesia Evaluation  Patient identified by MRN, date of birth, ID band Patient awake    Reviewed: Allergy & Precautions, NPO status , Patient's Chart, lab work & pertinent test results  Airway Mallampati: III  TM Distance: >3 FB Neck ROM: Full    Dental no notable dental hx. (+) Teeth Intact, Dental Advisory Given   Pulmonary neg pulmonary ROS   Pulmonary exam normal breath sounds clear to auscultation       Cardiovascular hypertension, + CAD  Normal cardiovascular exam Rhythm:Regular Rate:Normal  TEE 2019 Study Conclusions   - Left ventricle: The cavity size was normal. Wall thickness was    normal. Systolic function was normal.  - Aortic valve: No evidence of vegetation. There was mild    regurgitation.  - Mitral valve: No evidence of vegetation.  - Left atrium: No evidence of thrombus in the atrial cavity or    appendage.  - Atrial septum: No defect or patent foramen ovale was identified.     Neuro/Psych  Headaches PSYCHIATRIC DISORDERS Anxiety     CVA, No Residual Symptoms    GI/Hepatic negative GI ROS, Neg liver ROS,,,  Endo/Other  negative endocrine ROS    Renal/GU Renal InsufficiencyRenal disease  negative genitourinary   Musculoskeletal  (+) Arthritis ,    Abdominal   Peds  Hematology  (+) Blood dyscrasia (on plavix, last dose 8/26 PM)   Anesthesia Other Findings 77 yo female who presents to PAT prior to surgery above. PMH significant for HTN, CAD, hx of CVA (in 2019), s/p loop recorder insertion (10/2017), pre-diabetes (HgA1c 6.2), arthritis s/p right THA (03/2021), CKD stage 2, anxiety  Reproductive/Obstetrics                             Anesthesia Physical Anesthesia Plan  ASA: 3  Anesthesia Plan: Spinal   Post-op Pain Management: Tylenol PO (pre-op)*   Induction:   PONV Risk Score and Plan: 2 and Treatment may vary due to age or medical condition, Propofol infusion,  Dexamethasone and Ondansetron  Airway Management Planned: Natural Airway  Additional Equipment:   Intra-op Plan:   Post-operative Plan:   Informed Consent: I have reviewed the patients History and Physical, chart, labs and discussed the procedure including the risks, benefits and alternatives for the proposed anesthesia with the patient or authorized representative who has indicated his/her understanding and acceptance.     Dental advisory given  Plan Discussed with: CRNA  Anesthesia Plan Comments: (See PAT note from 8/22 by Sherlie Ban PA-C )        Anesthesia Quick Evaluation

## 2023-04-28 ENCOUNTER — Encounter (HOSPITAL_COMMUNITY): Payer: Self-pay | Admitting: Orthopedic Surgery

## 2023-04-29 ENCOUNTER — Ambulatory Visit (HOSPITAL_COMMUNITY): Payer: Medicare Other

## 2023-04-29 ENCOUNTER — Other Ambulatory Visit: Payer: Self-pay

## 2023-04-29 ENCOUNTER — Observation Stay (HOSPITAL_COMMUNITY): Payer: Medicare Other

## 2023-04-29 ENCOUNTER — Ambulatory Visit (HOSPITAL_COMMUNITY): Payer: Medicare Other | Admitting: Medical

## 2023-04-29 ENCOUNTER — Encounter (HOSPITAL_COMMUNITY): Payer: Self-pay | Admitting: Orthopedic Surgery

## 2023-04-29 ENCOUNTER — Ambulatory Visit (HOSPITAL_BASED_OUTPATIENT_CLINIC_OR_DEPARTMENT_OTHER): Payer: Medicare Other | Admitting: Anesthesiology

## 2023-04-29 ENCOUNTER — Encounter (HOSPITAL_COMMUNITY)
Admission: RE | Disposition: A | Discharge status: Home / Self Care | Payer: Self-pay | Source: Home / Self Care | Attending: Orthopedic Surgery

## 2023-04-29 ENCOUNTER — Observation Stay (HOSPITAL_COMMUNITY)
Admission: RE | Admit: 2023-04-29 | Discharge: 2023-04-30 | Disposition: A | Discharge status: Home / Self Care | Payer: Medicare Other | Attending: Orthopedic Surgery | Admitting: Orthopedic Surgery

## 2023-04-29 DIAGNOSIS — Z96641 Presence of right artificial hip joint: Secondary | ICD-10-CM | POA: Diagnosis not present

## 2023-04-29 DIAGNOSIS — E785 Hyperlipidemia, unspecified: Secondary | ICD-10-CM | POA: Diagnosis not present

## 2023-04-29 DIAGNOSIS — I129 Hypertensive chronic kidney disease with stage 1 through stage 4 chronic kidney disease, or unspecified chronic kidney disease: Secondary | ICD-10-CM | POA: Insufficient documentation

## 2023-04-29 DIAGNOSIS — Z7902 Long term (current) use of antithrombotics/antiplatelets: Secondary | ICD-10-CM | POA: Insufficient documentation

## 2023-04-29 DIAGNOSIS — Z96642 Presence of left artificial hip joint: Secondary | ICD-10-CM | POA: Diagnosis not present

## 2023-04-29 DIAGNOSIS — I2510 Atherosclerotic heart disease of native coronary artery without angina pectoris: Secondary | ICD-10-CM | POA: Diagnosis not present

## 2023-04-29 DIAGNOSIS — Z79899 Other long term (current) drug therapy: Secondary | ICD-10-CM | POA: Diagnosis not present

## 2023-04-29 DIAGNOSIS — Z8673 Personal history of transient ischemic attack (TIA), and cerebral infarction without residual deficits: Secondary | ICD-10-CM | POA: Insufficient documentation

## 2023-04-29 DIAGNOSIS — N189 Chronic kidney disease, unspecified: Secondary | ICD-10-CM | POA: Diagnosis not present

## 2023-04-29 DIAGNOSIS — Z7982 Long term (current) use of aspirin: Secondary | ICD-10-CM | POA: Insufficient documentation

## 2023-04-29 DIAGNOSIS — N182 Chronic kidney disease, stage 2 (mild): Secondary | ICD-10-CM | POA: Insufficient documentation

## 2023-04-29 DIAGNOSIS — M1612 Unilateral primary osteoarthritis, left hip: Secondary | ICD-10-CM | POA: Diagnosis present

## 2023-04-29 DIAGNOSIS — Z471 Aftercare following joint replacement surgery: Secondary | ICD-10-CM | POA: Diagnosis not present

## 2023-04-29 DIAGNOSIS — Z96643 Presence of artificial hip joint, bilateral: Secondary | ICD-10-CM | POA: Diagnosis not present

## 2023-04-29 HISTORY — PX: TOTAL HIP ARTHROPLASTY: SHX124

## 2023-04-29 LAB — ABO/RH: ABO/RH(D): A POS

## 2023-04-29 SURGERY — ARTHROPLASTY, HIP, TOTAL, ANTERIOR APPROACH
Anesthesia: Spinal | Site: Hip | Laterality: Left

## 2023-04-29 MED ORDER — ALUM & MAG HYDROXIDE-SIMETH 200-200-20 MG/5ML PO SUSP: 30.0000 mL | ORAL | Status: DC | PRN

## 2023-04-29 MED ORDER — ONDANSETRON HCL 4 MG/2ML IJ SOLN
INTRAMUSCULAR | Status: DC | PRN
  Administered 2023-04-29: 4 mg via INTRAVENOUS

## 2023-04-29 MED ORDER — TRAMADOL HCL 50 MG PO TABS
ORAL_TABLET | ORAL | Status: AC
  Filled 2023-04-29: qty 1

## 2023-04-29 MED ORDER — PROPOFOL 1000 MG/100ML IV EMUL
INTRAVENOUS | Status: AC
  Filled 2023-04-29: qty 100

## 2023-04-29 MED ORDER — LACTATED RINGERS IV SOLN: INTRAVENOUS | Status: DC

## 2023-04-29 MED ORDER — PROPOFOL 10 MG/ML IV BOLUS
INTRAVENOUS | Status: DC | PRN
  Administered 2023-04-29 (×2): 20 mg via INTRAVENOUS

## 2023-04-29 MED ORDER — TRIAMTERENE-HCTZ 75-50 MG PO TABS
1.0000 | ORAL_TABLET | Freq: Every day | ORAL | Status: DC
  Filled 2023-04-29: qty 1

## 2023-04-29 MED ORDER — ACETAMINOPHEN 500 MG PO TABS
ORAL_TABLET | ORAL | Status: AC
  Filled 2023-04-29: qty 2

## 2023-04-29 MED ORDER — ACETAMINOPHEN 500 MG PO TABS
1000.0000 mg | ORAL_TABLET | Freq: Four times a day (QID) | ORAL | Status: DC
  Administered 2023-04-29 – 2023-04-30 (×4): 1000 mg via ORAL
  Filled 2023-04-29 (×4): qty 2

## 2023-04-29 MED ORDER — STERILE WATER FOR IRRIGATION IR SOLN
Status: DC | PRN
  Administered 2023-04-29: 2000 mL

## 2023-04-29 MED ORDER — 0.9 % SODIUM CHLORIDE (POUR BTL) OPTIME
TOPICAL | Status: DC | PRN
  Administered 2023-04-29: 1000 mL

## 2023-04-29 MED ORDER — EPHEDRINE 5 MG/ML INJ
INTRAVENOUS | Status: AC
  Filled 2023-04-29: qty 10

## 2023-04-29 MED ORDER — SODIUM CHLORIDE (PF) 0.9 % IJ SOLN
INTRAMUSCULAR | Status: DC | PRN
  Administered 2023-04-29: 30 mL

## 2023-04-29 MED ORDER — BUPIVACAINE-EPINEPHRINE 0.25% -1:200000 IJ SOLN
INTRAMUSCULAR | Status: AC
  Filled 2023-04-29: qty 1

## 2023-04-29 MED ORDER — PHENYLEPHRINE HCL-NACL 20-0.9 MG/250ML-% IV SOLN
INTRAVENOUS | Status: DC | PRN
Start: 2023-04-29 — End: 2023-04-29
  Administered 2023-04-29: 35 ug/min via INTRAVENOUS

## 2023-04-29 MED ORDER — POVIDONE-IODINE 10 % EX SWAB: 2.0000 | Freq: Once | CUTANEOUS | Status: DC

## 2023-04-29 MED ORDER — ORAL CARE MOUTH RINSE: 15.0000 mL | Freq: Once | OROMUCOSAL | Status: AC

## 2023-04-29 MED ORDER — SODIUM CHLORIDE (PF) 0.9 % IJ SOLN
INTRAMUSCULAR | Status: AC
  Filled 2023-04-29: qty 50

## 2023-04-29 MED ORDER — CLONAZEPAM 0.5 MG PO TABS: 0.5000 mg | ORAL_TABLET | Freq: Every day | ORAL | Status: DC | PRN

## 2023-04-29 MED ORDER — ONDANSETRON HCL 4 MG PO TABS: 4.0000 mg | ORAL_TABLET | Freq: Four times a day (QID) | ORAL | Status: DC | PRN

## 2023-04-29 MED ORDER — SODIUM CHLORIDE 0.9 % IV SOLN: INTRAVENOUS | Status: DC

## 2023-04-29 MED ORDER — DIPHENHYDRAMINE HCL 12.5 MG/5ML PO ELIX: 12.5000 mg | ORAL_SOLUTION | ORAL | Status: DC | PRN

## 2023-04-29 MED ORDER — METOCLOPRAMIDE HCL 5 MG PO TABS: 5.0000 mg | ORAL_TABLET | Freq: Three times a day (TID) | ORAL | Status: DC | PRN

## 2023-04-29 MED ORDER — BISACODYL 10 MG RE SUPP: 10.0000 mg | Freq: Every day | RECTAL | Status: DC | PRN

## 2023-04-29 MED ORDER — ONDANSETRON HCL 4 MG/2ML IJ SOLN: 4.0000 mg | Freq: Four times a day (QID) | INTRAMUSCULAR | Status: DC | PRN

## 2023-04-29 MED ORDER — DEXAMETHASONE SODIUM PHOSPHATE 10 MG/ML IJ SOLN
INTRAMUSCULAR | Status: DC | PRN
  Administered 2023-04-29: 4 mg via INTRAVENOUS

## 2023-04-29 MED ORDER — IRBESARTAN 150 MG PO TABS
300.0000 mg | ORAL_TABLET | Freq: Every day | ORAL | Status: DC
  Administered 2023-04-30: 300 mg via ORAL
  Filled 2023-04-29: qty 2

## 2023-04-29 MED ORDER — ACETAMINOPHEN 500 MG PO TABS
1000.0000 mg | ORAL_TABLET | Freq: Once | ORAL | Status: AC
  Administered 2023-04-29: 1000 mg via ORAL
  Filled 2023-04-29: qty 2

## 2023-04-29 MED ORDER — MENTHOL 3 MG MT LOZG: 1.0000 | LOZENGE | OROMUCOSAL | Status: DC | PRN

## 2023-04-29 MED ORDER — BUPIVACAINE-EPINEPHRINE (PF) 0.25% -1:200000 IJ SOLN
INTRAMUSCULAR | Status: DC | PRN
  Administered 2023-04-29: 30 mL

## 2023-04-29 MED ORDER — POLYETHYLENE GLYCOL 3350 17 G PO PACK
17.0000 g | PACK | Freq: Two times a day (BID) | ORAL | Status: DC
  Administered 2023-04-29 – 2023-04-30 (×2): 17 g via ORAL
  Filled 2023-04-29 (×2): qty 1

## 2023-04-29 MED ORDER — ASPIRIN 325 MG PO TBEC
325.0000 mg | DELAYED_RELEASE_TABLET | Freq: Two times a day (BID) | ORAL | Status: DC
  Administered 2023-04-30: 325 mg via ORAL
  Filled 2023-04-29: qty 1

## 2023-04-29 MED ORDER — PROPOFOL 500 MG/50ML IV EMUL
INTRAVENOUS | Status: DC | PRN
  Administered 2023-04-29: 75 ug/kg/min via INTRAVENOUS

## 2023-04-29 MED ORDER — HYDROMORPHONE HCL 1 MG/ML IJ SOLN
0.5000 mg | INTRAMUSCULAR | Status: DC | PRN
  Administered 2023-04-29: 0.5 mg via INTRAVENOUS
  Filled 2023-04-29: qty 1

## 2023-04-29 MED ORDER — KETOROLAC TROMETHAMINE 15 MG/ML IJ SOLN
7.5000 mg | Freq: Four times a day (QID) | INTRAMUSCULAR | Status: AC
  Administered 2023-04-29 – 2023-04-30 (×4): 7.5 mg via INTRAVENOUS
  Filled 2023-04-29 (×4): qty 1

## 2023-04-29 MED ORDER — CHLORHEXIDINE GLUCONATE 0.12 % MT SOLN
15.0000 mL | Freq: Once | OROMUCOSAL | Status: AC
  Administered 2023-04-29: 15 mL via OROMUCOSAL

## 2023-04-29 MED ORDER — DEXAMETHASONE SODIUM PHOSPHATE 10 MG/ML IJ SOLN
10.0000 mg | Freq: Once | INTRAMUSCULAR | Status: AC
  Administered 2023-04-30: 10 mg via INTRAVENOUS
  Filled 2023-04-29: qty 1

## 2023-04-29 MED ORDER — KETOROLAC TROMETHAMINE 30 MG/ML IJ SOLN
INTRAMUSCULAR | Status: AC
  Filled 2023-04-29: qty 1

## 2023-04-29 MED ORDER — CEFAZOLIN SODIUM-DEXTROSE 2-4 GM/100ML-% IV SOLN
2.0000 g | INTRAVENOUS | Status: AC
  Administered 2023-04-29: 2 g via INTRAVENOUS
  Filled 2023-04-29: qty 100

## 2023-04-29 MED ORDER — ZOLPIDEM TARTRATE 5 MG PO TABS
5.0000 mg | ORAL_TABLET | Freq: Every day | ORAL | Status: DC
  Administered 2023-04-29: 5 mg via ORAL
  Filled 2023-04-29: qty 1

## 2023-04-29 MED ORDER — FENTANYL CITRATE (PF) 100 MCG/2ML IJ SOLN
INTRAMUSCULAR | Status: AC
  Filled 2023-04-29: qty 2

## 2023-04-29 MED ORDER — METOCLOPRAMIDE HCL 5 MG/ML IJ SOLN: 5.0000 mg | Freq: Three times a day (TID) | INTRAMUSCULAR | Status: DC | PRN

## 2023-04-29 MED ORDER — METHOCARBAMOL 500 MG PO TABS
500.0000 mg | ORAL_TABLET | Freq: Four times a day (QID) | ORAL | Status: DC | PRN
  Administered 2023-04-29 – 2023-04-30 (×2): 500 mg via ORAL
  Filled 2023-04-29: qty 1

## 2023-04-29 MED ORDER — SENNA 8.6 MG PO TABS
2.0000 | ORAL_TABLET | Freq: Every day | ORAL | Status: DC
  Administered 2023-04-29: 17.2 mg via ORAL
  Filled 2023-04-29: qty 2

## 2023-04-29 MED ORDER — KETOROLAC TROMETHAMINE 30 MG/ML IJ SOLN
INTRAMUSCULAR | Status: DC | PRN
  Administered 2023-04-29: 30 mg via INTRA_ARTICULAR

## 2023-04-29 MED ORDER — ATENOLOL 50 MG PO TABS
50.0000 mg | ORAL_TABLET | Freq: Every day | ORAL | Status: DC
  Administered 2023-04-30: 50 mg via ORAL
  Filled 2023-04-29: qty 1

## 2023-04-29 MED ORDER — PROPOFOL 10 MG/ML IV BOLUS
INTRAVENOUS | Status: AC
  Filled 2023-04-29: qty 20

## 2023-04-29 MED ORDER — PHENOL 1.4 % MT LIQD: 1.0000 | OROMUCOSAL | Status: DC | PRN

## 2023-04-29 MED ORDER — BUPIVACAINE IN DEXTROSE 0.75-8.25 % IT SOLN
INTRATHECAL | Status: DC | PRN
  Administered 2023-04-29: 1.8 mL via INTRATHECAL

## 2023-04-29 MED ORDER — METHOCARBAMOL 500 MG IVPB - SIMPLE MED
INTRAVENOUS | Status: AC
  Filled 2023-04-29: qty 55

## 2023-04-29 MED ORDER — METHOCARBAMOL 500 MG IVPB - SIMPLE MED
500.0000 mg | Freq: Four times a day (QID) | INTRAVENOUS | Status: DC | PRN
  Administered 2023-04-29: 500 mg via INTRAVENOUS

## 2023-04-29 MED ORDER — FENTANYL CITRATE (PF) 100 MCG/2ML IJ SOLN
INTRAMUSCULAR | Status: DC | PRN
  Administered 2023-04-29 (×2): 50 ug via INTRAVENOUS

## 2023-04-29 MED ORDER — EZETIMIBE 10 MG PO TABS
10.0000 mg | ORAL_TABLET | Freq: Every day | ORAL | Status: DC
  Administered 2023-04-29: 10 mg via ORAL
  Filled 2023-04-29: qty 1

## 2023-04-29 MED ORDER — DEXAMETHASONE SODIUM PHOSPHATE 10 MG/ML IJ SOLN
8.0000 mg | Freq: Once | INTRAMUSCULAR | Status: DC
Start: 2023-04-29 — End: 2023-04-29

## 2023-04-29 MED ORDER — TRAMADOL HCL 50 MG PO TABS
50.0000 mg | ORAL_TABLET | Freq: Four times a day (QID) | ORAL | Status: DC | PRN
  Administered 2023-04-29: 50 mg via ORAL

## 2023-04-29 MED ORDER — HYDROMORPHONE HCL 2 MG PO TABS
2.0000 mg | ORAL_TABLET | ORAL | Status: DC | PRN
  Administered 2023-04-29 – 2023-04-30 (×4): 2 mg via ORAL
  Filled 2023-04-29 (×4): qty 1

## 2023-04-29 MED ORDER — CLOPIDOGREL BISULFATE 75 MG PO TABS
75.0000 mg | ORAL_TABLET | Freq: Every day | ORAL | Status: DC
  Administered 2023-04-30: 75 mg via ORAL
  Filled 2023-04-29: qty 1

## 2023-04-29 MED ORDER — FENTANYL CITRATE PF 50 MCG/ML IJ SOSY: 25.0000 ug | PREFILLED_SYRINGE | INTRAMUSCULAR | Status: DC | PRN

## 2023-04-29 MED ORDER — NORTRIPTYLINE HCL 25 MG PO CAPS
25.0000 mg | ORAL_CAPSULE | Freq: Every day | ORAL | Status: DC
  Administered 2023-04-29: 25 mg via ORAL
  Filled 2023-04-29: qty 1

## 2023-04-29 MED ORDER — TRANEXAMIC ACID-NACL 1000-0.7 MG/100ML-% IV SOLN
1000.0000 mg | Freq: Once | INTRAVENOUS | Status: AC
  Administered 2023-04-29: 1000 mg via INTRAVENOUS
  Filled 2023-04-29: qty 100

## 2023-04-29 MED ORDER — CEFAZOLIN SODIUM-DEXTROSE 2-4 GM/100ML-% IV SOLN
2.0000 g | Freq: Four times a day (QID) | INTRAVENOUS | Status: AC
  Administered 2023-04-29 (×2): 2 g via INTRAVENOUS
  Filled 2023-04-29 (×2): qty 100

## 2023-04-29 MED ORDER — EPHEDRINE SULFATE-NACL 50-0.9 MG/10ML-% IV SOSY
PREFILLED_SYRINGE | INTRAVENOUS | Status: DC | PRN
  Administered 2023-04-29: 5 mg via INTRAVENOUS
  Administered 2023-04-29: 10 mg via INTRAVENOUS
  Administered 2023-04-29: 7.5 mg via INTRAVENOUS
  Administered 2023-04-29: 2.5 mg via INTRAVENOUS

## 2023-04-29 MED ORDER — TRANEXAMIC ACID-NACL 1000-0.7 MG/100ML-% IV SOLN
1000.0000 mg | INTRAVENOUS | Status: AC
  Administered 2023-04-29: 1000 mg via INTRAVENOUS
  Filled 2023-04-29: qty 100

## 2023-04-29 MED ORDER — LIDOCAINE 2% (20 MG/ML) 5 ML SYRINGE
INTRAMUSCULAR | Status: DC | PRN
  Administered 2023-04-29: 50 mg via INTRAVENOUS

## 2023-04-29 SURGICAL SUPPLY — 44 items
ADH SKN CLS APL DERMABOND .7 (GAUZE/BANDAGES/DRESSINGS) ×1
ARTICULEZE HEAD (Hips) ×1 IMPLANT
BAG COUNTER SPONGE SURGICOUNT (BAG) IMPLANT
BAG SPEC THK2 15X12 ZIP CLS (MISCELLANEOUS)
BAG SPNG CNTER NS LX DISP (BAG)
BAG ZIPLOCK 12X15 (MISCELLANEOUS) IMPLANT
BLADE SAG 18X100X1.27 (BLADE) ×2 IMPLANT
COVER PERINEAL POST (MISCELLANEOUS) ×2 IMPLANT
COVER SURGICAL LIGHT HANDLE (MISCELLANEOUS) ×2 IMPLANT
CUP ACETBLR 52 OD PINNACLE (Hips) IMPLANT
DERMABOND ADVANCED .7 DNX12 (GAUZE/BANDAGES/DRESSINGS) ×2 IMPLANT
DRAPE FOOT SWITCH (DRAPES) ×2 IMPLANT
DRAPE STERI IOBAN 125X83 (DRAPES) ×2 IMPLANT
DRAPE U-SHAPE 47X51 STRL (DRAPES) ×4 IMPLANT
DRESSING AQUACEL AG SP 3.5X10 (GAUZE/BANDAGES/DRESSINGS) ×2 IMPLANT
DRSG AQUACEL AG ADV 3.5X10 (GAUZE/BANDAGES/DRESSINGS) IMPLANT
DRSG AQUACEL AG SP 3.5X10 (GAUZE/BANDAGES/DRESSINGS) ×1
DURAPREP 26ML APPLICATOR (WOUND CARE) ×2 IMPLANT
ELECT REM PT RETURN 15FT ADLT (MISCELLANEOUS) ×2 IMPLANT
GLOVE BIO SURGEON STRL SZ 6 (GLOVE) ×2 IMPLANT
GLOVE BIOGEL PI IND STRL 6.5 (GLOVE) ×2 IMPLANT
GLOVE BIOGEL PI IND STRL 7.5 (GLOVE) ×2 IMPLANT
GLOVE ORTHO TXT STRL SZ7.5 (GLOVE) ×4 IMPLANT
GOWN STRL REUS W/ TWL LRG LVL3 (GOWN DISPOSABLE) ×4 IMPLANT
GOWN STRL REUS W/TWL LRG LVL3 (GOWN DISPOSABLE) ×2
HEAD ARTICULEZE (Hips) IMPLANT
HOLDER FOLEY CATH W/STRAP (MISCELLANEOUS) ×2 IMPLANT
KIT TURNOVER KIT A (KITS) IMPLANT
LINER NEUTRAL 52X36MM PLUS 4 (Liner) IMPLANT
NDL SAFETY ECLIP 18X1.5 (MISCELLANEOUS) IMPLANT
PACK ANTERIOR HIP CUSTOM (KITS) ×2 IMPLANT
SCREW 6.5MMX30MM (Screw) IMPLANT
STEM FEMORAL SZ5 HIGH ACTIS (Stem) IMPLANT
SUT MNCRL AB 4-0 PS2 18 (SUTURE) ×2 IMPLANT
SUT STRATAFIX 0 PDS 27 VIOLET (SUTURE) ×1
SUT VIC AB 1 CT1 36 (SUTURE) ×6 IMPLANT
SUT VIC AB 2-0 CT1 27 (SUTURE) ×2
SUT VIC AB 2-0 CT1 TAPERPNT 27 (SUTURE) ×4 IMPLANT
SUTURE STRATFX 0 PDS 27 VIOLET (SUTURE) ×2 IMPLANT
SYR 3ML LL SCALE MARK (SYRINGE) IMPLANT
TRAY FOLEY MTR SLVR 14FR STAT (SET/KITS/TRAYS/PACK) IMPLANT
TRAY FOLEY MTR SLVR 16FR STAT (SET/KITS/TRAYS/PACK) IMPLANT
TUBE SUCTION HIGH CAP CLEAR NV (SUCTIONS) ×2 IMPLANT
WATER STERILE IRR 1000ML POUR (IV SOLUTION) ×2 IMPLANT

## 2023-04-29 NOTE — Interval H&P Note (Signed)
History and Physical Interval Note:  04/29/2023 9:43 AM  Cassandra Bailey  has presented today for surgery, with the diagnosis of Left hip osteoarthritis.  The various methods of treatment have been discussed with the patient and family. After consideration of risks, benefits and other options for treatment, the patient has consented to  Procedure(s): TOTAL HIP ARTHROPLASTY ANTERIOR APPROACH (Left) as a surgical intervention.  The patient's history has been reviewed, patient examined, no change in status, stable for surgery.  I have reviewed the patient's chart and labs.  Questions were answered to the patient's satisfaction.     [name]

## 2023-04-29 NOTE — Anesthesia Procedure Notes (Addendum)
Spinal  Patient location during procedure: OR Start time: 04/29/2023 10:58 AM End time: 04/29/2023 11:04 AM Reason for block: surgical anesthesia Staffing Performed: anesthesiologist  Anesthesiologist: Elmer Picker, MD Performed by: Elmer Picker, MD Authorized by: Elmer Picker, MD   Preanesthetic Checklist Completed: patient identified, IV checked, risks and benefits discussed, surgical consent, monitors and equipment checked, pre-op evaluation and timeout performed Spinal Block Patient position: sitting Prep: DuraPrep and site prepped and draped Patient monitoring: cardiac monitor, continuous pulse ox and blood pressure Approach: midline Location: L3-4 Injection technique: single-shot Needle Needle type: Quincke  Needle gauge: 22 G Needle length: 9 cm Assessment Sensory level: T6 Events: CSF return Additional Notes Functioning IV was confirmed and monitors were applied. Sterile prep and drape, including hand hygiene and sterile gloves were used. The patient was positioned and the spine was prepped. The skin was anesthetized with lidocaine.  Free flow of clear CSF was obtained prior to injecting local anesthetic into the CSF.  The spinal needle aspirated freely following injection.  The needle was carefully withdrawn.  The patient tolerated the procedure well.   Multiple attempts. First attempt at L4/5 with 24G Pencan. Second attempt at L3/4 with 24G Pencan. Third attempt at L3/4 with 22G Quinke with success.

## 2023-04-29 NOTE — Transfer of Care (Signed)
Immediate Anesthesia Transfer of Care Note  Patient: Cassandra Bailey  Procedure(s) Performed: Procedure(s): TOTAL HIP ARTHROPLASTY ANTERIOR APPROACH (Left)  Patient Location: PACU  Anesthesia Type:Spinal  Level of Consciousness:  sedated, patient cooperative and responds to stimulation  Airway & Oxygen Therapy:Patient Spontanous Breathing and Patient connected to face mask oxgen  Post-op Assessment:  Report given to PACU RN and Post -op Vital signs reviewed and stable  Post vital signs:  Reviewed and stable  Last Vitals:  Vitals:   04/29/23 0922 04/29/23 1225  BP: 137/68 (!) 94/49  Pulse: 65 64  Resp: 16 15  Temp: 36.6 C   SpO2: 98% 100%    Complications: No apparent anesthesia complications

## 2023-04-29 NOTE — H&P (Signed)
TOTAL HIP ADMISSION H&P  Patient is admitted for left total hip arthroplasty.  Therapy Plans: HEP Disposition: Home with children Planned DVT Prophylaxis: Plavix DME needed: none PCP: Dr. Nicholos Johns, clearance received TXA: IV Allergies: codeine - Vomiting Anesthesia Concerns: BMI: 28.6 Last HgbA1c: 6.2%   Other: - Hx of right THA by Dr. Lequita Halt in 2022 (stayed 2 nights due to pain) - tramadol/hydromorphone (got confused at home last time with meds), robaxin, tylenol, celebrex - Hx of stroke in 2019 -- plavix  Subjective:  Chief Complaint: left hip pain  HPI: Cassandra Bailey, 77 y.o. female, has a history of pain and functional disability in the left hip(s) due to arthritis and patient has failed non-surgical conservative treatments for greater than 12 weeks to include NSAID's and/or analgesics and activity modification.  Onset of symptoms was gradual starting 2 years ago with gradually worsening course since that time.The patient noted no past surgery on the left hip(s).  Patient currently rates pain in the left hip at 8 out of 10 with activity. Patient has worsening of pain with activity and weight bearing, pain that interfers with activities of daily living, and pain with passive range of motion. Patient has evidence of joint space narrowing by imaging studies. This condition presents safety issues increasing the risk of falls.  There is no current active infection.  Patient Active Problem List   Diagnosis Date Noted   OA (osteoarthritis) of hip 04/18/2021   Primary osteoarthritis of right hip 04/18/2021   HLD (hyperlipidemia) 11/11/2017   HTN (hypertension) 11/11/2017   Hypokalemia 11/11/2017   Stroke (HCC) 11/10/2017   Past Medical History:  Diagnosis Date   Anxiety    Arthritis    hips, knee, back   Chronic kidney disease    CKD2   Coronary artery disease    Headache    History of kidney stones    Hypertension    Pre-diabetes    Stroke (HCC) 11/09/2017     Past Surgical History:  Procedure Laterality Date   LOOP RECORDER INSERTION N/A 11/11/2017   Procedure: LOOP RECORDER INSERTION;  Surgeon: Regan Lemming, MD;  Location: [facility]ASIVE [facility];  Service: Cardiovascular;  Laterality: N/A;   TEE WITHOUT CARDIOVERSION N/A 11/11/2017   Procedure: TRANSESOPHAGEAL ECHOCARDIOGRAM (TEE) with loop;  Surgeon: Elease Hashimoto Deloris Ping, MD;  Location: [facility];  Service: Cardiovascular;  Laterality: N/A;   TOTAL HIP ARTHROPLASTY Right 04/18/2021   Procedure: TOTAL HIP ARTHROPLASTY ANTERIOR APPROACH;  Surgeon: Ollen Gross, MD;  Location: [facility] ORS;  Service: Orthopedics;  Laterality: Right;   TUBAL LIGATION  11/1977   VARICOSE VEIN SURGERY Right 1979    No current facility-administered medications for this encounter.   Current Outpatient Medications  Medication Sig Dispense Refill Last Dose   atenolol (TENORMIN) 50 MG tablet Take 50 mg by mouth daily.  3    calcium carbonate (OS-CAL) 600 MG TABS tablet Take 600 mg by mouth 2 (two) times daily with a meal.      celecoxib (CELEBREX) 200 MG capsule Take 200 mg by mouth daily.      cholecalciferol (VITAMIN D) 1000 units tablet Take 1,000 Units by mouth 2 (two) times daily.      clonazePAM (KLONOPIN) 0.5 MG tablet Take 0.5 mg by mouth daily as needed for anxiety.  0    clopidogrel (PLAVIX) 75 MG tablet Take 1 tablet (75 mg total) by mouth daily. 30 tablet 0    ezetimibe (ZETIA) 10 MG tablet Take 10 mg by  mouth at bedtime.  3    Flaxseed, Linseed, (FLAXSEED OIL) 1000 MG CAPS Take 1,000 mg by mouth 2 (two) times daily.      magnesium oxide (MAG-OX) 400 MG tablet Take 400 mg by mouth daily.      nortriptyline (PAMELOR) 25 MG capsule Take 25 mg by mouth at bedtime.  3    Omega-3 Fatty Acids (FISH OIL PO) Take 1,000 mg by mouth 2 (two) times daily.      Propylene Glycol (SYSTANE BALANCE OP) Place 1 drop into both eyes 2 (two) times daily as needed (dry eyes).      telmisartan (MICARDIS) 80 MG tablet Take 80 mg  by mouth daily.      triamterene-hydrochlorothiazide (MAXZIDE) 75-50 MG tablet Take 1 tablet by mouth daily.  0    vitamin E 180 MG (400 UNITS) capsule Take 400 Units by mouth 2 (two) times daily.      zolpidem (AMBIEN) 10 MG tablet Take 10 mg by mouth at bedtime.   1    aspirin EC 325 MG EC tablet Take 1 tablet (325 mg total) by mouth 2 (two) times daily. Then take one 81 mg aspirin once a day for three weeks. Then discontinue aspirin. (Patient not taking: Reported on 04/10/2023) 40 tablet 0 Not Taking   HYDROmorphone (DILAUDID) 2 MG tablet Take 1-2 tablets (2-4 mg total) by mouth every 6 (six) hours as needed for severe pain. (Patient not taking: Reported on 04/10/2023) 42 tablet 0 Not Taking   methocarbamol (ROBAXIN) 500 MG tablet Take 1 tablet (500 mg total) by mouth every 6 (six) hours as needed for muscle spasms. (Patient not taking: Reported on 04/10/2023) 40 tablet 0 Not Taking   traMADol (ULTRAM) 50 MG tablet Take 1-2 tablets (50-100 mg total) by mouth every 6 (six) hours as needed for moderate pain. (Patient not taking: Reported on 04/10/2023) 40 tablet 0 Not Taking   Allergies  Allergen Reactions   Codeine Nausea And Vomiting    Social History   Tobacco Use   Smoking status: Never   Smokeless tobacco: Never  Substance Use Topics   Alcohol use: Not Currently    Family History  Problem Relation Age of Onset   Heart disease Father      Review of Systems  Constitutional:  Negative for chills and fever.  Respiratory:  Negative for cough and shortness of breath.   Cardiovascular:  Negative for chest pain.  Gastrointestinal:  Negative for nausea and vomiting.  Musculoskeletal:  Positive for arthralgias.     Objective:  Physical Exam Well nourished and well developed. General: Alert and oriented x3, cooperative and pleasant, no acute distress. Head: normocephalic, atraumatic, neck supple. Eyes: EOMI.  Musculoskeletal: Left hip exam: Painful and limited hip flexion internal  rotation to 10 degrees with pelvic tilting, external rotation over 20 degrees No significant lower extremity edema or erythema  Calves soft and nontender. Motor function intact in LE. Strength 5/5 LE bilaterally. Neuro: Distal pulses 2+. Sensation to light touch intact in LE.  Vital signs in last 24 hours:    Labs:   Estimated body mass index is 28.13 kg/m as calculated from the following:   Height as of 04/17/23: 5\' 8"  (1.727 m).   Weight as of 04/17/23: 83.9 kg.   Imaging Review Plain radiographs demonstrate severe degenerative joint disease of the left hip(s). The bone quality appears to be adequate for age and reported activity level.      Assessment/Plan:  End stage  arthritis, left hip(s)  The patient history, physical examination, clinical judgement of the provider and imaging studies are consistent with end stage degenerative joint disease of the left hip(s) and total hip arthroplasty is deemed medically necessary. The treatment options including medical management, injection therapy, arthroscopy and arthroplasty were discussed at length. The risks and benefits of total hip arthroplasty were presented and reviewed. The risks due to aseptic loosening, infection, stiffness, dislocation/subluxation,  thromboembolic complications and other imponderables were discussed.  The patient acknowledged the explanation, agreed to proceed with the plan and consent was signed. Patient is being admitted for inpatient treatment for surgery, pain control, PT, OT, prophylactic antibiotics, VTE prophylaxis, progressive ambulation and ADL's and discharge planning.The patient is planning to be discharged  home   Rosalene Billings, PA-C Orthopedic Surgery [facility] [place] [phone]

## 2023-04-29 NOTE — Discharge Instructions (Addendum)
INSTRUCTIONS AFTER JOINT REPLACEMENT    Pain Regimen Instructions:  - Take tylenol 1000 mg every 6-8 hours around the clock  - Take the muscle relaxant around the clock  - Then take 1/2 to 1 tablet of hydromorphone as needed every 4 hours as needed for severe pain  - Ice and elevate your leg as often as you can  Do not take over the counter pain medication until instructed by [place].  If this is not controlling your pain, or you need refills - call our office at [phone] or send a message via the [employer] portal.   Take the stool softeners provided until you are having regular bowel movements, and then you may discontinue.    Remove items at home which could result in a fall. This includes throw rugs or furniture in walking pathways ICE to the affected joint every three hours while awake for 30 minutes at a time, for at least the first 3-5 days, and then as needed for pain and swelling.  Continue to use ice for pain and swelling. You may notice swelling that will progress down to the foot and ankle.  This is normal after surgery.  Elevate your leg when you are not up walking on it.   Continue to use the breathing machine you got in the hospital (incentive spirometer) which will help keep your temperature down.  It is common for your temperature to cycle up and down following surgery, especially at night when you are not up moving around and exerting yourself.  The breathing machine keeps your lungs expanded and your temperature down.   DIET:  As you were doing prior to hospitalization, we recommend a well-balanced diet.  DRESSING / WOUND CARE / SHOWERING  Keep the surgical dressing until follow up.  The dressing is water proof, so you can shower without any extra covering.  IF THE DRESSING FALLS OFF or the wound gets wet inside, change the dressing with sterile gauze.  Please use good hand washing techniques before changing the dressing.  Do not use any lotions or creams on the incision until  instructed by your surgeon.    ACTIVITY  Increase activity slowly as tolerated, but follow the weight bearing instructions below.   No driving for 6 weeks or until further direction given by your physician.  You cannot drive while taking narcotics.  No lifting or carrying greater than 10 lbs. until further directed by your surgeon. Avoid periods of inactivity such as sitting longer than an hour when not asleep. This helps prevent blood clots.  You may return to work once you are authorized by your doctor.     WEIGHT BEARING   Weight bearing as tolerated with assist device (walker, cane, etc) as directed, use it as long as suggested by your surgeon or therapist, typically at least 4-6 weeks.   EXERCISES  Results after joint replacement surgery are often greatly improved when you follow the exercise, range of motion and muscle strengthening exercises prescribed by your doctor. Safety measures are also important to protect the joint from further injury. Any time any of these exercises cause you to have increased pain or swelling, decrease what you are doing until you are comfortable again and then slowly increase them. If you have problems or questions, call your caregiver or physical therapist for advice.   Rehabilitation is important following a joint replacement. After just a few days of immobilization, the muscles of the leg can become weakened and shrink (atrophy).  These exercises are designed to build up the tone and strength of the thigh and leg muscles and to improve motion. Often times heat used for twenty to thirty minutes before working out will loosen up your tissues and help with improving the range of motion but do not use heat for the first two weeks following surgery (sometimes heat can increase post-operative swelling).   These exercises can be done on a training (exercise) mat, on the floor, on a table or on a bed. Use whatever works the best and is most comfortable for you.     Use music or television while you are exercising so that the exercises are a pleasant break in your day. This will make your life better with the exercises acting as a break in your routine that you can look forward to.   Perform all exercises about fifteen times, three times per day or as directed.  You should exercise both the operative leg and the other leg as well.  Exercises include:   Quad Sets - Tighten up the muscle on the front of the thigh (Quad) and hold for 5-10 seconds.   Straight Leg Raises - With your knee straight (if you were given a brace, keep it on), lift the leg to 60 degrees, hold for 3 seconds, and slowly lower the leg.  Perform this exercise against resistance later as your leg gets stronger.  Leg Slides: Lying on your back, slowly slide your foot toward your buttocks, bending your knee up off the floor (only go as far as is comfortable). Then slowly slide your foot back down until your leg is flat on the floor again.  Angel Wings: Lying on your back spread your legs to the side as far apart as you can without causing discomfort.  Hamstring Strength:  Lying on your back, push your heel against the floor with your leg straight by tightening up the muscles of your buttocks.  Repeat, but this time bend your knee to a comfortable angle, and push your heel against the floor.  You may put a pillow under the heel to make it more comfortable if necessary.   A rehabilitation program following joint replacement surgery can speed recovery and prevent re-injury in the future due to weakened muscles. Contact your doctor or a physical therapist for more information on knee rehabilitation.    CONSTIPATION  Constipation is defined medically as fewer than three stools per week and severe constipation as less than one stool per week.  Even if you have a regular bowel pattern at home, your normal regimen is likely to be disrupted due to multiple reasons following surgery.  Combination of  anesthesia, postoperative narcotics, change in appetite and fluid intake all can affect your bowels.   YOU MUST use at least one of the following options; they are listed in order of increasing strength to get the job done.  They are all available over the counter, and you may need to use some, POSSIBLY even all of these options:    Drink plenty of fluids (prune juice may be helpful) and high fiber foods Colace 100 mg by mouth twice a day  Senokot for constipation as directed and as needed Dulcolax (bisacodyl), take with full glass of water  Miralax (polyethylene glycol) once or twice a day as needed.  If you have tried all these things and are unable to have a bowel movement in the first 3-4 days after surgery call either your surgeon or your primary  doctor.    If you experience loose stools or diarrhea, hold the medications until you stool forms back up.  If your symptoms do not get better within 1 week or if they get worse, check with your doctor.  If you experience "the worst abdominal pain ever" or develop nausea or vomiting, please contact the office immediately for further recommendations for treatment.   ITCHING:  If you experience itching with your medications, try taking only a single pain pill, or even half a pain pill at a time.  You can also use Benadryl over the counter for itching or also to help with sleep.   TED HOSE STOCKINGS:  Use stockings on both legs until for at least 2 weeks or as directed by physician office. They may be removed at night for sleeping.  MEDICATIONS:  See your medication summary on the "After Visit Summary" that nursing will review with you.  You may have some home medications which will be placed on hold until you complete the course of blood thinner medication.  It is important for you to complete the blood thinner medication as prescribed.  PRECAUTIONS:  If you experience chest pain or shortness of breath - call 911 immediately for transfer to the  hospital emergency department.   If you develop a fever greater that 101 F, purulent drainage from wound, increased redness or drainage from wound, foul odor from the wound/dressing, or calf pain - CONTACT YOUR SURGEON.                                                   FOLLOW-UP APPOINTMENTS:  If you do not already have a post-op appointment, please call the office for an appointment to be seen by your surgeon.  Guidelines for how soon to be seen are listed in your "After Visit Summary", but are typically between 1-4 weeks after surgery.  OTHER INSTRUCTIONS:   Knee Replacement:  Do not place pillow under knee, focus on keeping the knee straight while resting. CPM instructions: 0-90 degrees, 2 hours in the morning, 2 hours in the afternoon, and 2 hours in the evening. Place foam block, curve side up under heel at all times except when in CPM or when walking.  DO NOT modify, tear, cut, or change the foam block in any way.  POST-OPERATIVE OPIOID TAPER INSTRUCTIONS: It is important to wean off of your opioid medication as soon as possible. If you do not need pain medication after your surgery it is ok to stop day one. Opioids include: Codeine, Hydrocodone(Norco, Vicodin), Oxycodone(Percocet, oxycontin) and hydromorphone amongst others.  Long term and even short term use of opiods can cause: Increased pain response Dependence Constipation Depression Respiratory depression And more.  Withdrawal symptoms can include Flu like symptoms Nausea, vomiting And more Techniques to manage these symptoms Hydrate well Eat regular healthy meals Stay active Use relaxation techniques(deep breathing, meditating, yoga) Do Not substitute Alcohol to help with tapering If you have been on opioids for less than two weeks and do not have pain than it is ok to stop all together.  Plan to wean off of opioids This plan should start within one week post op of your joint replacement. Maintain the same interval or  time between taking each dose and first decrease the dose.  Cut the total daily intake of opioids by one  tablet each day Next start to increase the time between doses. The last dose that should be eliminated is the evening dose.   MAKE SURE YOU:  Understand these instructions.  Get help right away if you are not doing well or get worse.    Thank you for letting us be a part of your medical care team.  It is a privilege we respect greatly.  We hope these instructions will help you stay on track for a fast and full recovery!

## 2023-04-29 NOTE — Anesthesia Postprocedure Evaluation (Signed)
Anesthesia Post Note  Patient: OAKLEY LASEK  Procedure(s) Performed: TOTAL HIP ARTHROPLASTY ANTERIOR APPROACH (Left: Hip)     Patient location during evaluation: PACU Anesthesia Type: Spinal Level of consciousness: oriented and awake and alert Pain management: pain level controlled Vital Signs Assessment: post-procedure vital signs reviewed and stable Respiratory status: spontaneous breathing, respiratory function stable and patient connected to nasal cannula oxygen Cardiovascular status: blood pressure returned to baseline and stable Postop Assessment: no headache, no backache and no apparent nausea or vomiting Anesthetic complications: no  No notable events documented.  Last Vitals:  Vitals:   04/29/23 1330 04/29/23 1400  BP: 120/65 115/66  Pulse: (!) 59 (!) 58  Resp: 17 13  Temp: (!) 36.3 C   SpO2: 97% 98%    Last Pain:  Vitals:   04/29/23 1330  TempSrc:   PainSc: 0-No pain                 Chantz Montefusco L Aerabella Galasso

## 2023-04-29 NOTE — Care Plan (Signed)
Ortho Bundle Case Management Note  Patient Details  Name: Cassandra Bailey MRN: [id number] Date of Birth: 1946-06-28                  L THA on 04-29-23  DCP: Home with children  DME: No needs, has a RW  PT: HEP   DME Arranged:  N/A DME Agency:       Additional Comments: Please contact me with any questions of if this plan should need to change.   Ennis Forts, RN,CCM [facility]  [phone] 04/29/2023, 12:38 PM

## 2023-04-29 NOTE — Op Note (Signed)
NAME:  Cassandra Bailey                ACCOUNT NO.: [id number]      MEDICAL RECORD NO.: [id number]      FACILITY:  [facility]      PHYSICIAN:  [name]  DATE OF BIRTH:  [birth year]     DATE OF PROCEDURE:  04/29/2023                                 OPERATIVE REPORT         PREOPERATIVE DIAGNOSIS: Left  hip osteoarthritis.      POSTOPERATIVE DIAGNOSIS:  Left hip osteoarthritis.      PROCEDURE:  Left total hip replacement through an anterior approach   utilizing DePuy THR system, component size 52 mm pinnacle cup, a size 36+4 neutral   Altrex liner, a size 5 Hi Actis stem with a 36+5 Articuleze metal head ball.      SURGEON:  Madlyn Frankel. Charlann Boxer, M.D.      ASSISTANT:  Rosalene Billings, PA-C     ANESTHESIA:  Spinal.      SPECIMENS:  None.      COMPLICATIONS:  None.      BLOOD LOSS:  250 cc     DRAINS:  None.      INDICATION OF THE PROCEDURE:  Cassandra Bailey is a 77 y.o. female who had   presented to office for evaluation of left hip pain.  Radiographs revealed   progressive degenerative changes with bone-on-bone   articulation of the  hip joint, including subchondral cystic changes and osteophytes.  The patient had painful limited range of   motion significantly affecting their overall quality of life and function.  The patient was failing to    respond to conservative measures including medications and/or injections and activity modification and at this point was ready   to proceed with more definitive measures.  Consent was obtained for   benefit of pain relief.  Specific risks of infection, DVT, component   failure, dislocation, neurovascular injury, and need for revision surgery were reviewed in the office.     PROCEDURE IN DETAIL:  The patient was brought to operative theater.   Once adequate anesthesia, preoperative antibiotics, 2 gm of Ancef, 1 gm of Tranexamic Acid, and 10 mg of Decadron were administered, the patient was positioned supine on the [employer] table.  Once the patient was safely positioned with adequate padding of boney prominences we predraped out the hip, and used fluoroscopy to confirm orientation of the pelvis.      The left hip was then prepped and draped from proximal iliac crest to   mid thigh with a shower curtain technique.      Time-out was performed identifying the patient, planned procedure, and the appropriate extremity.     An incision was then made 2 cm lateral to the   anterior superior iliac spine extending over the orientation of the   tensor fascia lata muscle and sharp dissection was carried down to the   fascia of the muscle.      The fascia was then incised.  The muscle belly was identified and swept   laterally and retractor placed along the superior neck.  Following   cauterization of the circumflex vessels and removing some pericapsular   fat, a second cobra retractor was placed on the inferior neck.  A T-capsulotomy was made along the line of the   superior neck to the trochanteric fossa, then extended proximally and   distally.  Tag sutures were placed and the retractors were then placed   intracapsular.  We then identified the trochanteric fossa and   orientation of my neck cut and then made a neck osteotomy with the femur on traction.  The femoral   head was removed without difficulty or complication.  Traction was let   off and retractors were placed posterior and anterior around the   acetabulum.      The labrum and foveal tissue were debrided.  I began reaming with a 49 mm   reamer and reamed up to 51 mm reamer with good bony bed preparation and a 52 mm  cup was chosen.  The final 52 mm Pinnacle cup was then impacted under fluoroscopy to confirm the depth of penetration and orientation with respect to   Abduction and forward flexion.  A screw was placed into the ilium followed by the hole eliminator.  The final   36+4 neutral Altrex liner was impacted with good visualized rim fit.  The cup  was positioned anatomically within the acetabular portion of the pelvis.      At this point, the femur was rolled to 100 degrees.  Further capsule was   released off the inferior aspect of the femoral neck.  I then   released the superior capsule proximally.  With the leg in a neutral position the hook was placed laterally   along the femur under the vastus lateralis origin and elevated manually and then held in position using the hook attachment on the bed.  The leg was then extended and adducted with the leg rolled to 100   degrees of external rotation.  Retractors were placed along the medial calcar and posteriorly over the greater trochanter.  Once the proximal femur was fully   exposed, I used a box osteotome to set orientation.  I then began   broaching with the starting chili pepper broach and passed this by hand and then broached up to 5.  With the 5 broach in place I chose a high offset neck and did several trial reductions.  The offset was appropriate, leg lengths   appeared to be equal best matched with the +5 head ball trial confirmed radiographically.   Given these findings, I went ahead and dislocated the hip, repositioned all   retractors and positioned the right hip in the extended and abducted position.  The final 5 Hi Actis stem was   chosen and it was impacted down to the level of neck cut.  Based on this   and the trial reductions, a final 36+5 Articuleze metal ball was chosen and   impacted onto a clean and dry trunnion, and the hip was reduced.  The   hip had been irrigated throughout the case again at this point.  I did   reapproximate the superior capsular leaflet to the anterior leaflet   using #1 Vicryl.  The fascia of the   tensor fascia lata muscle was then reapproximated using #1 Vicryl and #0 Stratafix sutures.  The   remaining wound was closed with 2-0 Vicryl and running 4-0 Monocryl.   The hip was cleaned, dried, and dressed sterilely using Dermabond and    Aquacel dressing.  The patient was then brought   to recovery room in stable condition tolerating the procedure well.    Rosalene Billings, PA-C  was present for the entirety of the case involved from   preoperative positioning, perioperative retractor management, general   facilitation of the case, as well as primary wound closure as assistant.            Madlyn Frankel Charlann Boxer, M.D.        04/29/2023 9:48 AM

## 2023-04-29 NOTE — Evaluation (Signed)
Physical Therapy Evaluation Patient Details Name: Cassandra Bailey MRN: [id number] DOB: 25-Jan-1946 Today's Date: 04/29/2023  History of Present Illness  77 yo female s/p L THA anterior approach on 04/29/2023 due to failure of conservative measures. Pt PMH includes but is not limited to:  OA, CAD, HTN, Stroke, loop recorder placed in 2019 and R THA anterior approach on (04/18/2021) .  Clinical Impression     Cassandra Bailey is a 77 y.o. female POD 0 s/p L THA. Patient reports IND with mobility at baseline. Patient is now limited by functional impairments (see PT problem list below) and requires CGA and multimodal cues for bed mobility and min A and cues for transfers. Patient was able to ambulate 20 feet with RW and CGA level of assist with cues for encouragement. Patient instructed in exercise to facilitate ROM and circulation to manage edema.  Patient will benefit from continued skilled PT interventions to address impairments and progress towards PLOF. Acute PT will follow to progress mobility and stair training in preparation for safe discharge home with family support and HEP.       If plan is discharge home, recommend the following: A little help with walking and/or transfers;A little help with bathing/dressing/bathroom;Assistance with cooking/housework;Assist for transportation;Help with stairs or ramp for entrance   Can travel by private vehicle        Equipment Recommendations None recommended by PT  Recommendations for Other Services       Functional Status Assessment Patient has had a recent decline in their functional status and demonstrates the ability to make significant improvements in function in a reasonable and predictable amount of time.     Precautions / Restrictions Precautions Precautions: Fall Restrictions Weight Bearing Restrictions: No      Mobility  Bed Mobility Overal bed mobility: Needs Assistance Bed Mobility: Supine to Sit     Supine to sit: HOB elevated,  Contact guard     General bed mobility comments: increased time and multimodal cues    Transfers Overall transfer level: Needs assistance Equipment used: Rolling walker (2 wheels) Transfers: Sit to/from Stand Sit to Stand: Min assist, From elevated surface           General transfer comment: cues for encouragement and extension posture, facilitation for LLE WB initally    Ambulation/Gait Ambulation/Gait assistance: Contact guard assist Gait Distance (Feet): 20 Feet Assistive device: Rolling walker (2 wheels) Gait Pattern/deviations: Step-to pattern, Trunk flexed, Antalgic Gait velocity: decreased     General Gait Details: reliance on B UE support to offload LLE in stance phase, gait limited due to reports of weakness and nausea  Stairs            Wheelchair Mobility     Tilt Bed    Modified Rankin (Stroke Patients Only)       Balance Overall balance assessment: Needs assistance Sitting-balance support: Feet supported Sitting balance-Leahy Scale: Good     Standing balance support: Bilateral upper extremity supported, During functional activity, Reliant on assistive device for balance Standing balance-Leahy Scale: Poor                               Pertinent Vitals/Pain Pain Assessment Pain Assessment: Faces Faces Pain Scale: Hurts a little bit Pain Location: L hip and LE Pain Descriptors / Indicators: Aching, Constant, Discomfort, Operative site guarding Pain Intervention(s): Limited activity within patient's tolerance, Monitored during session, Premedicated before session, Repositioned, Ice applied  Home Living Family/patient expects to be discharged to:: Private residence Living Arrangements: Alone Available Help at Discharge: Family Type of Home: House Home Access: Stairs to enter Entrance Stairs-Rails: Right Entrance Stairs-Number of Steps: 3   Home Layout: One level Home Equipment: Cane - single Librarian, academic (2  wheels);Shower seat      Prior Function Prior Level of Function : Independent/Modified Independent             Mobility Comments: IND with ADLs, self care tasks and IADLs       Extremity/Trunk Assessment        Lower Extremity Assessment Lower Extremity Assessment: LLE deficits/detail LLE Deficits / Details: ankle DF/PF 5/5 LLE Sensation: WNL    Cervical / Trunk Assessment Cervical / Trunk Assessment:  (wfl)  Communication   Communication Communication: No apparent difficulties  Cognition Arousal: Alert Behavior During Therapy: WFL for tasks assessed/performed Overall Cognitive Status: Within Functional Limits for tasks assessed                                          General Comments      Exercises Total Joint Exercises Ankle Circles/Pumps: AROM, Both, 10 reps   Assessment/Plan    PT Assessment Patient needs continued PT services  PT Problem List Decreased strength;Decreased range of motion;Decreased activity tolerance;Decreased balance;Decreased mobility;Decreased coordination;Pain       PT Treatment Interventions DME instruction;Gait training;Stair training;Functional mobility training;Therapeutic activities;Therapeutic exercise;Balance training;Neuromuscular re-education;Patient/family education;Modalities    PT Goals (Current goals can be found in the Care Plan section)  Acute Rehab PT Goals Patient Stated Goal: to no longer walk with a wonky L hip PT Goal Formulation: With patient Time For Goal Achievement: 05/13/23 Potential to Achieve Goals: Good    Frequency 7X/week     Co-evaluation               AM-PAC PT "6 Clicks" Mobility  Outcome Measure Help needed turning from your back to your side while in a flat bed without using bedrails?: A Little Help needed moving from lying on your back to sitting on the side of a flat bed without using bedrails?: A Little Help needed moving to and from a bed to a chair (including a  wheelchair)?: A Little Help needed standing up from a chair using your arms (e.g., wheelchair or bedside chair)?: A Little Help needed to walk in hospital room?: A Little Help needed climbing 3-5 steps with a railing? : A Lot 6 Click Score: 17    End of Session Equipment Utilized During Treatment: Gait belt Activity Tolerance: Treatment limited secondary to medical complications (Comment) (reports of weakness and nausea) Patient left: in chair;with call bell/phone within reach;with nursing/sitter in room;with family/visitor present Nurse Communication: Mobility status PT Visit Diagnosis: Unsteadiness on feet (R26.81);Other abnormalities of gait and mobility (R26.89);Muscle weakness (generalized) (M62.81);Difficulty in walking, not elsewhere classified (R26.2);Pain Pain - Right/Left: Left Pain - part of body: Leg;Hip    Time: 7829-5621 PT Time Calculation (min) (ACUTE ONLY): 29 min   Charges:   PT Evaluation $PT Eval Low Complexity: 1 Low PT Treatments $Gait Training: 8-22 mins PT General Charges $$ ACUTE PT VISIT: 1 Visit         Johnny Bridge, PT Acute Rehab   Jacqualyn Posey 04/29/2023, 7:14 PM

## 2023-04-30 ENCOUNTER — Encounter (HOSPITAL_COMMUNITY): Payer: Self-pay | Admitting: Orthopedic Surgery

## 2023-04-30 DIAGNOSIS — I2510 Atherosclerotic heart disease of native coronary artery without angina pectoris: Secondary | ICD-10-CM | POA: Diagnosis not present

## 2023-04-30 DIAGNOSIS — N182 Chronic kidney disease, stage 2 (mild): Secondary | ICD-10-CM | POA: Diagnosis not present

## 2023-04-30 DIAGNOSIS — Z8673 Personal history of transient ischemic attack (TIA), and cerebral infarction without residual deficits: Secondary | ICD-10-CM | POA: Diagnosis not present

## 2023-04-30 DIAGNOSIS — M1612 Unilateral primary osteoarthritis, left hip: Secondary | ICD-10-CM | POA: Diagnosis not present

## 2023-04-30 DIAGNOSIS — Z96641 Presence of right artificial hip joint: Secondary | ICD-10-CM | POA: Diagnosis not present

## 2023-04-30 DIAGNOSIS — I129 Hypertensive chronic kidney disease with stage 1 through stage 4 chronic kidney disease, or unspecified chronic kidney disease: Secondary | ICD-10-CM | POA: Diagnosis not present

## 2023-04-30 LAB — BASIC METABOLIC PANEL
Anion gap: 11 (ref 5–15)
BUN: 26 mg/dL — ABNORMAL HIGH (ref 8–23)
CO2: 24 mmol/L (ref 22–32)
Calcium: 8.1 mg/dL — ABNORMAL LOW (ref 8.9–10.3)
Chloride: 101 mmol/L (ref 98–111)
Creatinine, Ser: 0.96 mg/dL (ref 0.44–1.00)
GFR, Estimated: >60 mL/min (ref >60)
Glucose, Bld: 132 mg/dL — ABNORMAL HIGH (ref 70–99)
Potassium: 3.1 mmol/L — ABNORMAL LOW (ref 3.5–5.1)
Sodium: 136 mmol/L (ref 135–145)

## 2023-04-30 LAB — CBC
HCT: 32.5 % — ABNORMAL LOW (ref 36.0–46.0)
Hemoglobin: 10.8 g/dL — ABNORMAL LOW (ref 12.0–15.0)
MCH: 30.4 pg (ref 26.0–34.0)
MCHC: 33.2 g/dL (ref 30.0–36.0)
MCV: 91.5 fL (ref 80.0–100.0)
Platelets: 223 10*3/uL (ref 150–400)
RBC: 3.55 MIL/uL — ABNORMAL LOW (ref 3.87–5.11)
RDW: 13.2 % (ref 11.5–15.5)
WBC: 9.8 10*3/uL (ref 4.0–10.5)
nRBC: 0 % (ref 0.0–0.2)

## 2023-04-30 MED ORDER — HYDROMORPHONE HCL 2 MG PO TABS: 1.0000 mg | ORAL_TABLET | ORAL | 0 refills | Status: AC | PRN

## 2023-04-30 MED ORDER — CELECOXIB 200 MG PO CAPS: 200.0000 mg | ORAL_CAPSULE | Freq: Two times a day (BID) | ORAL | 0 refills | Status: AC

## 2023-04-30 MED ORDER — TRIAMTERENE-HCTZ 37.5-25 MG PO TABS
2.0000 | ORAL_TABLET | Freq: Every day | ORAL | Status: DC
  Administered 2023-04-30: 2 via ORAL
  Filled 2023-04-30: qty 2

## 2023-04-30 MED ORDER — METHOCARBAMOL 500 MG PO TABS: 500.0000 mg | ORAL_TABLET | Freq: Four times a day (QID) | ORAL | 2 refills | Status: AC | PRN

## 2023-04-30 MED ORDER — SENNA 8.6 MG PO TABS: 2.0000 | ORAL_TABLET | Freq: Every day | ORAL | 0 refills | Status: AC

## 2023-04-30 MED ORDER — POLYETHYLENE GLYCOL 3350 17 G PO PACK: 17.0000 g | PACK | Freq: Two times a day (BID) | ORAL | 0 refills | Status: AC

## 2023-04-30 MED ORDER — POTASSIUM CHLORIDE CRYS ER 20 MEQ PO TBCR
20.0000 meq | EXTENDED_RELEASE_TABLET | Freq: Once | ORAL | Status: AC
  Administered 2023-04-30: 20 meq via ORAL
  Filled 2023-04-30: qty 1

## 2023-04-30 NOTE — Progress Notes (Addendum)
Physical Therapy Treatment Patient Details Name: Cassandra Bailey MRN: [id number] DOB: 01-May-1946 Today's Date: 04/30/2023   History of Present Illness 77 yo female s/p L THA anterior approach on 04/29/2023 due to failure of conservative measures. Pt PMH includes but is not limited to:  OA, CAD, HTN, Stroke, loop recorder placed in 2019 and R THA anterior approach on (04/18/2021) .    PT Comments  Pt is progressing well with mobility, she ambulated 140' with RW and demonstrates good understanding of HEP. Stair training initiated, pt would like to practice them again this afternoon during second session. I expect she'll be ready to DC home after 2nd session from a PT standpoint.    If plan is discharge home, recommend the following: A little help with walking and/or transfers;A little help with bathing/dressing/bathroom;Assistance with cooking/housework;Assist for transportation;Help with stairs or ramp for entrance   Can travel by private vehicle        Equipment Recommendations  None recommended by PT    Recommendations for Other Services       Precautions / Restrictions Precautions Precautions: Fall Restrictions Weight Bearing Restrictions: No LLE Weight Bearing: Weight bearing as tolerated     Mobility  Bed Mobility Overal bed mobility: Needs Assistance Bed Mobility: Supine to Sit     Supine to sit: HOB elevated, Contact guard     General bed mobility comments: increased time and multimodal cues, used gait belt as leg lifter    Transfers Overall transfer level: Needs assistance Equipment used: Rolling walker (2 wheels) Transfers: Sit to/from Stand Sit to Stand: From elevated surface, Contact guard assist           General transfer comment: VCs hand placement    Ambulation/Gait Ambulation/Gait assistance: Contact guard assist Gait Distance (Feet): 140 Feet Assistive device: Rolling walker (2 wheels) Gait Pattern/deviations: Step-to pattern, Trunk flexed,  Antalgic Gait velocity: decreased     General Gait Details: steady, no loss of balance, good sequencing   Stairs Stairs: Yes Stairs assistance: Contact guard assist Stair Management: Two rails, Forwards Number of Stairs: 3 General stair comments: VCs sequencing   Wheelchair Mobility     Tilt Bed    Modified Rankin (Stroke Patients Only)       Balance Overall balance assessment: Needs assistance Sitting-balance support: Feet supported Sitting balance-Leahy Scale: Good     Standing balance support: Bilateral upper extremity supported, During functional activity, Reliant on assistive device for balance Standing balance-Leahy Scale: Poor                              Cognition Arousal: Alert Behavior During Therapy: WFL for tasks assessed/performed Overall Cognitive Status: Within Functional Limits for tasks assessed                                          Exercises Total Joint Exercises Ankle Circles/Pumps: AROM, Both, 10 reps Quad Sets: AROM, Both, 5 reps, Supine Heel Slides: AAROM, Left, 5 reps, Supine Hip ABduction/ADduction: AAROM, LEft, 5 reps, Supine Long Arc Quad: AROM, Left, 5 reps, Seated    General Comments        Pertinent Vitals/Pain Pain Assessment Pain Assessment: 0-10 Pain Score: 5  Pain Location: L hip Pain Descriptors / Indicators: Sore Pain Intervention(s): Limited activity within patient's tolerance, Monitored during session, RN gave pain meds during session,  Ice applied    Home Living                          Prior Function            PT Goals (current goals can now be found in the care plan section) Acute Rehab PT Goals Patient Stated Goal: to no longer walk with a wonky L hip PT Goal Formulation: With patient Time For Goal Achievement: 05/13/23 Potential to Achieve Goals: Good Progress towards PT goals: Progressing toward goals    Frequency    7X/week      PT Plan       Co-evaluation              AM-PAC PT "6 Clicks" Mobility   Outcome Measure  Help needed turning from your back to your side while in a flat bed without using bedrails?: A Little Help needed moving from lying on your back to sitting on the side of a flat bed without using bedrails?: A Little Help needed moving to and from a bed to a chair (including a wheelchair)?: A Little Help needed standing up from a chair using your arms (e.g., wheelchair or bedside chair)?: A Little Help needed to walk in hospital room?: A Little Help needed climbing 3-5 steps with a railing? : A Little 6 Click Score: 18    End of Session Equipment Utilized During Treatment: Gait belt Activity Tolerance: Patient tolerated treatment well Patient left: in chair;with call bell/phone within reach (no chair alarm box in room) Nurse Communication: Mobility status PT Visit Diagnosis: Unsteadiness on feet (R26.81);Other abnormalities of gait and mobility (R26.89);Muscle weakness (generalized) (M62.81);Difficulty in walking, not elsewhere classified (R26.2);Pain Pain - Right/Left: Left Pain - part of body: Hip     Time: 0930-0959 PT Time Calculation (min) (ACUTE ONLY): 29 min  Charges:    $Gait Training: 8-22 mins $Therapeutic Exercise: 8-22 mins PT General Charges $$ ACUTE PT VISIT: 1 Visit                     Tamala Ser PT 04/30/2023  Acute Rehabilitation Services  Office [phone]

## 2023-04-30 NOTE — TOC Transition Note (Signed)
Transition of Care [facility]) - CM/SW Discharge Note  Patient Details  Name: Cassandra Bailey MRN: [id number] Date of Birth: August 26, 1946  Transition of Care [facility]) CM/SW Contact:  Ewing Schlein, LCSW Phone Number: 04/30/2023, 10:11 AM  Clinical Narrative: Patient is expected to discharge home after working with PT. CSW met with patient to confirm discharge plan. Patient will go home with a home exercise plan (HEP). Patient has a rolling walker and cane at home, so there are no DME needs at this time. TOC signing off.    Final next level of care: Home/Self Care Barriers to Discharge: No Barriers Identified  Patient Goals and CMS Choice Choice offered to / list presented to : NA  Discharge Plan and Services Additional resources added to the After Visit Summary for          DME Arranged: N/A DME Agency: NA  Social Determinants of Health (SDOH) Interventions SDOH Screenings   Food Insecurity: Patient Declined (04/29/2023)  Housing: Low Risk  (04/29/2023)  Transportation Needs: No Transportation Needs (04/29/2023)  Utilities: Not At Risk (04/29/2023)  Tobacco Use: Low Risk  (04/29/2023)   Readmission Risk Interventions     No data to display

## 2023-04-30 NOTE — Progress Notes (Signed)
Physical Therapy Treatment Patient Details Name: NYERA RESA MRN: [id number] DOB: 1946/04/15 Today's Date: 04/30/2023   History of Present Illness 77 yo female s/p L THA anterior approach on 04/29/2023 due to failure of conservative measures. Pt PMH includes but is not limited to:  OA, CAD, HTN, Stroke, loop recorder placed in 2019 and R THA anterior approach on (04/18/2021) .    PT Comments  Reviewed stair technique and HEP with pt and daughter.  She is ready to DC home from a PT standpoint.    If plan is discharge home, recommend the following: A little help with walking and/or transfers;A little help with bathing/dressing/bathroom;Assistance with cooking/housework;Assist for transportation;Help with stairs or ramp for entrance   Can travel by private vehicle        Equipment Recommendations  None recommended by PT    Recommendations for Other Services       Precautions / Restrictions Precautions Precautions: Fall Restrictions Weight Bearing Restrictions: No LLE Weight Bearing: Weight bearing as tolerated     Mobility  Bed Mobility Overal bed mobility: Needs Assistance Bed Mobility: Supine to Sit     Supine to sit: HOB elevated, Contact guard     General bed mobility comments: up in recliner    Transfers Overall transfer level: Needs assistance Equipment used: Rolling walker (2 wheels) Transfers: Sit to/from Stand Sit to Stand: From elevated surface, Contact guard assist           General transfer comment: VCs hand placement    Ambulation/Gait Ambulation/Gait assistance: Contact guard assist Gait Distance (Feet): 130 Feet Assistive device: Rolling walker (2 wheels) Gait Pattern/deviations: Step-to pattern, Trunk flexed, Antalgic, Decreased step length - right, Decreased step length - left Gait velocity: decreased     General Gait Details: steady, no loss of balance, good sequencing, VCs for posture   Stairs Stairs: Yes Stairs assistance: Contact  guard assist Stair Management: Forwards, One rail Right, With cane Number of Stairs: 3 General stair comments: VCs sequencing   Wheelchair Mobility     Tilt Bed    Modified Rankin (Stroke Patients Only)       Balance Overall balance assessment: Needs assistance Sitting-balance support: Feet supported Sitting balance-Leahy Scale: Good     Standing balance support: Bilateral upper extremity supported, During functional activity, Reliant on assistive device for balance Standing balance-Leahy Scale: Poor                              Cognition Arousal: Alert Behavior During Therapy: WFL for tasks assessed/performed Overall Cognitive Status: Within Functional Limits for tasks assessed                                          Exercises Total Joint Exercises Ankle Circles/Pumps: AROM, Both, 10 reps Quad Sets: AROM, Both, 5 reps, Supine Heel Slides: AAROM, 5 reps, Supine, Left Hip ABduction/ADduction: AAROM, 5 reps, Supine, Left Long Arc Quad: AROM, Right, 5 reps, Seated    General Comments        Pertinent Vitals/Pain Pain Assessment Pain Assessment: 0-10 Pain Score: 5  Pain Location: L hip Pain Descriptors / Indicators: Sore Pain Intervention(s): Limited activity within patient's tolerance, Monitored during session, Premedicated before session, Ice applied    Home Living  Prior Function            PT Goals (current goals can now be found in the care plan section) Acute Rehab PT Goals Patient Stated Goal: to no longer walk with a wonky L hip PT Goal Formulation: With patient Time For Goal Achievement: 05/13/23 Potential to Achieve Goals: Good Progress towards PT goals: Progressing toward goals    Frequency    7X/week      PT Plan      Co-evaluation              AM-PAC PT "6 Clicks" Mobility   Outcome Measure  Help needed turning from your back to your side while in a flat  bed without using bedrails?: A Little Help needed moving from lying on your back to sitting on the side of a flat bed without using bedrails?: A Little Help needed moving to and from a bed to a chair (including a wheelchair)?: A Little Help needed standing up from a chair using your arms (e.g., wheelchair or bedside chair)?: A Little Help needed to walk in hospital room?: A Little Help needed climbing 3-5 steps with a railing? : A Little 6 Click Score: 18    End of Session Equipment Utilized During Treatment: Gait belt Activity Tolerance: Patient tolerated treatment well Patient left: in chair;with call bell/phone within reach;with family/visitor present (no chair alarm box in room) Nurse Communication: Mobility status PT Visit Diagnosis: Unsteadiness on feet (R26.81);Other abnormalities of gait and mobility (R26.89);Muscle weakness (generalized) (M62.81);Difficulty in walking, not elsewhere classified (R26.2);Pain Pain - Right/Left: Left Pain - part of body: Hip     Time: 2440-1027 PT Time Calculation (min) (ACUTE ONLY): 23 min  Charges:    $Gait Training: 8-22 mins $Therapeutic Exercise: 8-22 mins PT General Charges $$ ACUTE PT VISIT: 1 Visit                     Tamala Ser PT 04/30/2023  Acute Rehabilitation Services  Office [phone]

## 2023-04-30 NOTE — Progress Notes (Signed)
Pt alert and oriented. Surgical dressing clean, dry and intact. Pt has No questions or queries regarding discharge instructions. Belongings sent home with pt.

## 2023-04-30 NOTE — Progress Notes (Signed)
   Subjective: 1 Day Post-Op Procedure(s) (LRB): TOTAL HIP ARTHROPLASTY ANTERIOR APPROACH (Left) Patient reports pain as mild.   Patient seen in rounds by Dr. Charlann Boxer. Patient is resting in bed on exam this morning. No acute events overnight. Foley catheter removed. Patient ambulated 20 feet with PT yesterday. We will start therapy today.   Objective: Vital signs in last 24 hours: Temp:  [96.5 F (35.8 C)-97.9 F (36.6 C)] 97.5 F (36.4 C) (09/04 0555) Pulse Rate:  [55-77] 70 (09/04 0555) Resp:  [10-18] 17 (09/04 0555) BP: (92-137)/(49-75) 101/63 (09/04 0555) SpO2:  [96 %-100 %] 98 % (09/04 0555) Weight:  [83.9 kg] 83.9 kg (09/03 0934)  Intake/Output from previous day:  Intake/Output Summary (Last 24 hours) at 04/30/2023 0853 Last data filed at 04/30/2023 0804 Gross per 24 hour  Intake 3403.6 ml  Output 700 ml  Net 2703.6 ml     Intake/Output this shift: Total I/O In: 240 [P.O.:240] Out: -   Labs: Recent Labs    04/30/23 0322  HGB 10.8*   Recent Labs    04/30/23 0322  WBC 9.8  RBC 3.55*  HCT 32.5*  PLT 223   Recent Labs    04/30/23 0322  NA 136  K 3.1*  CL 101  CO2 24  BUN 26*  CREATININE 0.96  GLUCOSE 132*  CALCIUM 8.1*   No results for input(s): "LABPT", "INR" in the last 72 hours.  Exam: General - Patient is Alert and Oriented Extremity - Neurologically intact Sensation intact distally Intact pulses distally Dorsiflexion/Plantar flexion intact Dressing - dressing C/D/I Motor Function - intact, moving foot and toes well on exam.   Past Medical History:  Diagnosis Date   Anxiety    Arthritis    hips, knee, back   Chronic kidney disease    CKD2   Coronary artery disease    Headache    History of kidney stones    Hypertension    Pre-diabetes    Stroke (HCC) 11/09/2017    Assessment/Plan: 1 Day Post-Op Procedure(s) (LRB): TOTAL HIP ARTHROPLASTY ANTERIOR APPROACH (Left) Principal Problem:   S/P total left hip arthroplasty  Estimated  body mass index is 28.13 kg/m as calculated from the following:   Height as of this encounter: 5\' 8"  (1.727 m).   Weight as of this encounter: 83.9 kg. Advance diet Up with therapy D/C IV fluids  DVT Prophylaxis -  plavix Weight bearing as tolerated.  Hgb stable at 10.8 this AM. K 3.1 > will give 1 dose of kdur  Plan is to go Home after hospital stay. Plan for discharge today after meeting goals with therapy. Follow up in the office in 2 weeks.   Rosalene Billings, PA-C Orthopedic Surgery [phone] 04/30/2023, 8:53 AM

## 2023-04-30 NOTE — Care Management Obs Status (Signed)
MEDICARE OBSERVATION STATUS NOTIFICATION   Patient Details  Name: Cassandra Bailey MRN: [id number] Date of Birth: 1946/08/21   Medicare Observation Status Notification Given:  Yes    Ewing Schlein, LCSW 04/30/2023, 1:29 PM

## 2023-04-30 NOTE — Plan of Care (Signed)

## 2023-05-12 NOTE — Discharge Summary (Signed)
Patient ID: Cassandra Bailey MRN: [id number] DOB/AGE: [birth year] 77 y.o.  Admit date: 04/29/2023 Discharge date: 04/30/2023  Admission Diagnoses:  Left hip osteoarthritis  Discharge Diagnoses:  Principal Problem:   S/P total left hip arthroplasty   Past Medical History:  Diagnosis Date   Anxiety    Arthritis    hips, knee, back   Chronic kidney disease    CKD2   Coronary artery disease    Headache    History of kidney stones    Hypertension    Pre-diabetes    Stroke (HCC) 11/09/2017    Surgeries: Procedure(s): TOTAL HIP ARTHROPLASTY ANTERIOR APPROACH on 04/29/2023   Consultants:   Discharged Condition: Improved  Hospital Course: Cassandra Bailey is an 77 y.o. female who was admitted 04/29/2023 for operative treatment ofS/P total left hip arthroplasty. Patient has severe unremitting pain that affects sleep, daily activities, and work/hobbies. After pre-op clearance the patient was taken to the operating room on 04/29/2023 and underwent  Procedure(s): TOTAL HIP ARTHROPLASTY ANTERIOR APPROACH.    Patient was given perioperative antibiotics:  Anti-infectives (From admission, onward)    Start     Dose/Rate Route Frequency Ordered Stop   04/29/23 1800  ceFAZolin (ANCEF) IVPB 2g/100 mL premix        2 g 200 mL/hr over 30 Minutes Intravenous Every 6 hours 04/29/23 1517 04/30/23 0010   04/29/23 0915  ceFAZolin (ANCEF) IVPB 2g/100 mL premix        2 g 200 mL/hr over 30 Minutes Intravenous On call to O.R. 04/29/23 0910 04/29/23 1057        Patient was given sequential compression devices, early ambulation, and chemoprophylaxis to prevent DVT. Patient worked with PT and was meeting their goals regarding safe ambulation and transfers.  Patient benefited maximally from hospital stay and there were no complications.    Recent vital signs: No data found.   Recent laboratory studies: No results for input(s): "WBC", "HGB", "HCT", "PLT", "NA", "K", "CL", "CO2", "BUN", "CREATININE",  "GLUCOSE", "INR", "CALCIUM" in the last 72 hours.  Invalid input(s): "PT", "2"   Discharge Medications:   Allergies as of 04/30/2023       Reactions   Codeine Nausea And Vomiting        Medication List     STOP taking these medications    traMADol 50 MG tablet Commonly known as: ULTRAM       TAKE these medications    aspirin EC 325 MG tablet Take 1 tablet (325 mg total) by mouth 2 (two) times daily. Then take one 81 mg aspirin once a day for three weeks. Then discontinue aspirin.   atenolol 50 MG tablet Commonly known as: TENORMIN Take 50 mg by mouth daily.   calcium carbonate 600 MG Tabs tablet Commonly known as: OS-CAL Take 600 mg by mouth 2 (two) times daily with a meal.   celecoxib 200 MG capsule Commonly known as: CELEBREX Take 1 capsule (200 mg total) by mouth 2 (two) times daily. What changed: when to take this   cholecalciferol 1000 units tablet Commonly known as: VITAMIN D Take 1,000 Units by mouth 2 (two) times daily.   clonazePAM 0.5 MG tablet Commonly known as: KLONOPIN Take 0.5 mg by mouth daily as needed for anxiety.   clopidogrel 75 MG tablet Commonly known as: PLAVIX Take 1 tablet (75 mg total) by mouth daily.   ezetimibe 10 MG tablet Commonly known as: ZETIA Take 10 mg by mouth at bedtime.   FISH OIL PO  Take 1,000 mg by mouth 2 (two) times daily.   Flaxseed Oil 1000 MG Caps Take 1,000 mg by mouth 2 (two) times daily.   HYDROmorphone 2 MG tablet Commonly known as: DILAUDID Take 0.5-1 tablets (1-2 mg total) by mouth every 4 (four) hours as needed for severe pain. What changed:  how much to take when to take this   magnesium oxide 400 MG tablet Commonly known as: MAG-OX Take 400 mg by mouth daily.   methocarbamol 500 MG tablet Commonly known as: ROBAXIN Take 1 tablet (500 mg total) by mouth every 6 (six) hours as needed for muscle spasms.   nortriptyline 25 MG capsule Commonly known as: PAMELOR Take 25 mg by mouth at  bedtime.   polyethylene glycol 17 g packet Commonly known as: MIRALAX / GLYCOLAX Take 17 g by mouth 2 (two) times daily.   senna 8.6 MG Tabs tablet Commonly known as: SENOKOT Take 2 tablets (17.2 mg total) by mouth at bedtime.   SYSTANE BALANCE OP Place 1 drop into both eyes 2 (two) times daily as needed (dry eyes).   telmisartan 80 MG tablet Commonly known as: MICARDIS Take 80 mg by mouth daily.   triamterene-hydrochlorothiazide 75-50 MG tablet Commonly known as: MAXZIDE Take 1 tablet by mouth daily.   vitamin E 180 MG (400 UNITS) capsule Take 400 Units by mouth 2 (two) times daily.   zolpidem 10 MG tablet Commonly known as: AMBIEN Take 10 mg by mouth at bedtime.               Discharge Care Instructions  (From admission, onward)           Start     Ordered   04/30/23 0000  Change dressing       Comments: Maintain surgical dressing until follow up in the clinic. If the edges start to pull up, may reinforce with tape. If the dressing is no longer working, may remove and cover with gauze and tape, but must keep the area dry and clean.  Call with any questions or concerns.   04/30/23 0857            Diagnostic Studies: DG Pelvis Portable  Result Date: 04/29/2023 CLINICAL DATA:  Status post hip arthroplasty. EXAM: PORTABLE PELVIS 1-2 VIEWS COMPARISON:  None Available. FINDINGS: Left hip arthroplasty in expected alignment. No periprosthetic lucency or fracture. Recent postsurgical change includes air and edema in the soft tissues. IMPRESSION: Left hip arthroplasty without immediate postoperative complication. Electronically Signed   By: Narda Rutherford M.D.   On: 04/29/2023 15:24   DG HIP UNILAT WITH PELVIS 1V LEFT  Result Date: 04/29/2023 CLINICAL DATA:  Left hip replacement. EXAM: DG HIP (WITH OR WITHOUT PELVIS) 1V*L* COMPARISON:  None Available. FINDINGS: Eight fluoroscopic spot views of the pelvis and left hip obtained in the operating room. Sequential  images during hip arthroplasty. Fluoroscopy time 5 seconds. Dose 0.9529 mGy. Prior right hip arthroplasty is partially included. IMPRESSION: Intraoperative fluoroscopy for left hip arthroplasty. Electronically Signed   By: Narda Rutherford M.D.   On: 04/29/2023 12:28   DG C-Arm 1-60 Min-No Report  Result Date: 04/29/2023 Fluoroscopy was utilized by the requesting physician.  No radiographic interpretation.    Disposition: Discharge disposition: 01-Home or Self Care       Discharge Instructions     Call MD / Call 911   Complete by: As directed    If you experience chest pain or shortness of breath, CALL 911 and be transported  to the hospital emergency room.  If you develope a fever above 101 F, pus (white drainage) or increased drainage or redness at the wound, or calf pain, call your surgeon's office.   Change dressing   Complete by: As directed    Maintain surgical dressing until follow up in the clinic. If the edges start to pull up, may reinforce with tape. If the dressing is no longer working, may remove and cover with gauze and tape, but must keep the area dry and clean.  Call with any questions or concerns.   Constipation Prevention   Complete by: As directed    Drink plenty of fluids.  Prune juice may be helpful.  You may use a stool softener, such as Colace (over the counter) 100 mg twice a day.  Use MiraLax (over the counter) for constipation as needed.   Diet - low sodium heart healthy   Complete by: As directed    Increase activity slowly as tolerated   Complete by: As directed    Weight bearing as tolerated with assist device (walker, cane, etc) as directed, use it as long as suggested by your surgeon or therapist, typically at least 4-6 weeks.   Post-operative opioid taper instructions:   Complete by: As directed    POST-OPERATIVE OPIOID TAPER INSTRUCTIONS: It is important to wean off of your opioid medication as soon as possible. If you do not need pain medication after  your surgery it is ok to stop day one. Opioids include: Codeine, Hydrocodone(Norco, Vicodin), Oxycodone(Percocet, oxycontin) and hydromorphone amongst others.  Long term and even short term use of opiods can cause: Increased pain response Dependence Constipation Depression Respiratory depression And more.  Withdrawal symptoms can include Flu like symptoms Nausea, vomiting And more Techniques to manage these symptoms Hydrate well Eat regular healthy meals Stay active Use relaxation techniques(deep breathing, meditating, yoga) Do Not substitute Alcohol to help with tapering If you have been on opioids for less than two weeks and do not have pain than it is ok to stop all together.  Plan to wean off of opioids This plan should start within one week post op of your joint replacement. Maintain the same interval or time between taking each dose and first decrease the dose.  Cut the total daily intake of opioids by one tablet each day Next start to increase the time between doses. The last dose that should be eliminated is the evening dose.      TED hose   Complete by: As directed    Use stockings (TED hose) for 2 weeks on both leg(s).  You may remove them at night for sleeping.        Follow-up Information     Durene Romans, MD. Go on 05/14/2023.   Specialty: Orthopedic Surgery Why: You are scheduled for first post op appt on Wednesday Sept 18 at 11:45am. Contact information: [address] [place] [address] [phone]                  Signed: Cassandria Anger 05/12/2023, 2:51 PM

## 2023-06-18 DIAGNOSIS — Z471 Aftercare following joint replacement surgery: Secondary | ICD-10-CM | POA: Diagnosis not present

## 2023-06-18 DIAGNOSIS — Z96642 Presence of left artificial hip joint: Secondary | ICD-10-CM | POA: Diagnosis not present

## 2023-07-31 DIAGNOSIS — Z471 Aftercare following joint replacement surgery: Secondary | ICD-10-CM | POA: Diagnosis not present

## 2023-07-31 DIAGNOSIS — Z96642 Presence of left artificial hip joint: Secondary | ICD-10-CM | POA: Diagnosis not present

## 2023-07-31 DIAGNOSIS — M545 Low back pain, unspecified: Secondary | ICD-10-CM | POA: Diagnosis not present

## 2023-07-31 DIAGNOSIS — Z96643 Presence of artificial hip joint, bilateral: Secondary | ICD-10-CM | POA: Diagnosis not present

## 2023-08-07 DIAGNOSIS — F419 Anxiety disorder, unspecified: Secondary | ICD-10-CM | POA: Diagnosis not present

## 2023-08-07 DIAGNOSIS — M150 Primary generalized (osteo)arthritis: Secondary | ICD-10-CM | POA: Diagnosis not present

## 2023-08-07 DIAGNOSIS — R5383 Other fatigue: Secondary | ICD-10-CM | POA: Diagnosis not present

## 2023-08-07 DIAGNOSIS — I10 Essential (primary) hypertension: Secondary | ICD-10-CM | POA: Diagnosis not present

## 2023-08-07 DIAGNOSIS — E782 Mixed hyperlipidemia: Secondary | ICD-10-CM | POA: Diagnosis not present

## 2023-08-07 DIAGNOSIS — Z79899 Other long term (current) drug therapy: Secondary | ICD-10-CM | POA: Diagnosis not present

## 2023-08-07 DIAGNOSIS — N182 Chronic kidney disease, stage 2 (mild): Secondary | ICD-10-CM | POA: Diagnosis not present

## 2023-08-07 DIAGNOSIS — R7303 Prediabetes: Secondary | ICD-10-CM | POA: Diagnosis not present

## 2023-08-14 DIAGNOSIS — R7303 Prediabetes: Secondary | ICD-10-CM | POA: Diagnosis not present

## 2023-08-14 DIAGNOSIS — R319 Hematuria, unspecified: Secondary | ICD-10-CM | POA: Diagnosis not present

## 2023-08-14 DIAGNOSIS — N182 Chronic kidney disease, stage 2 (mild): Secondary | ICD-10-CM | POA: Diagnosis not present

## 2023-08-14 DIAGNOSIS — Z0000 Encounter for general adult medical examination without abnormal findings: Secondary | ICD-10-CM | POA: Diagnosis not present

## 2023-08-14 DIAGNOSIS — F419 Anxiety disorder, unspecified: Secondary | ICD-10-CM | POA: Diagnosis not present

## 2023-08-14 DIAGNOSIS — Z23 Encounter for immunization: Secondary | ICD-10-CM | POA: Diagnosis not present

## 2023-08-14 DIAGNOSIS — M150 Primary generalized (osteo)arthritis: Secondary | ICD-10-CM | POA: Diagnosis not present

## 2023-08-14 DIAGNOSIS — E782 Mixed hyperlipidemia: Secondary | ICD-10-CM | POA: Diagnosis not present

## 2023-08-14 DIAGNOSIS — Z79899 Other long term (current) drug therapy: Secondary | ICD-10-CM | POA: Diagnosis not present

## 2023-08-14 DIAGNOSIS — N390 Urinary tract infection, site not specified: Secondary | ICD-10-CM | POA: Diagnosis not present

## 2023-08-14 DIAGNOSIS — I10 Essential (primary) hypertension: Secondary | ICD-10-CM | POA: Diagnosis not present

## 2023-09-04 DIAGNOSIS — N390 Urinary tract infection, site not specified: Secondary | ICD-10-CM | POA: Diagnosis not present

## 2023-11-27 DIAGNOSIS — M1711 Unilateral primary osteoarthritis, right knee: Secondary | ICD-10-CM | POA: Diagnosis not present

## 2024-03-04 DIAGNOSIS — E782 Mixed hyperlipidemia: Secondary | ICD-10-CM | POA: Diagnosis not present

## 2024-03-04 DIAGNOSIS — I10 Essential (primary) hypertension: Secondary | ICD-10-CM | POA: Diagnosis not present

## 2024-03-04 DIAGNOSIS — N182 Chronic kidney disease, stage 2 (mild): Secondary | ICD-10-CM | POA: Diagnosis not present

## 2024-03-04 DIAGNOSIS — R7303 Prediabetes: Secondary | ICD-10-CM | POA: Diagnosis not present

## 2024-03-11 DIAGNOSIS — E782 Mixed hyperlipidemia: Secondary | ICD-10-CM | POA: Diagnosis not present

## 2024-03-11 DIAGNOSIS — Z79899 Other long term (current) drug therapy: Secondary | ICD-10-CM | POA: Diagnosis not present

## 2024-03-11 DIAGNOSIS — F419 Anxiety disorder, unspecified: Secondary | ICD-10-CM | POA: Diagnosis not present

## 2024-03-11 DIAGNOSIS — R7303 Prediabetes: Secondary | ICD-10-CM | POA: Diagnosis not present

## 2024-03-11 DIAGNOSIS — I10 Essential (primary) hypertension: Secondary | ICD-10-CM | POA: Diagnosis not present

## 2024-03-11 DIAGNOSIS — M150 Primary generalized (osteo)arthritis: Secondary | ICD-10-CM | POA: Diagnosis not present

## 2024-03-11 DIAGNOSIS — N182 Chronic kidney disease, stage 2 (mild): Secondary | ICD-10-CM | POA: Diagnosis not present

## 2024-04-07 DIAGNOSIS — H35363 Drusen (degenerative) of macula, bilateral: Secondary | ICD-10-CM | POA: Diagnosis not present
